# Patient Record
Sex: Male | Born: 1970 | Race: White | Hispanic: No | Marital: Married | Smoking: Never smoker
Health system: Southern US, Community
[De-identification: ages and names within clinical notes are randomized; demographics above are authoritative.]

## PROBLEM LIST (undated history)

## (undated) DIAGNOSIS — N200 Calculus of kidney: Secondary | ICD-10-CM

## (undated) HISTORY — PX: CERVICAL DISC SURGERY: SHX588

---

## 2001-09-23 ENCOUNTER — Encounter: Payer: Self-pay | Admitting: Emergency Medicine

## 2001-09-23 ENCOUNTER — Emergency Department (HOSPITAL_COMMUNITY): Admission: EM | Admit: 2001-09-23 | Discharge: 2001-09-23 | Payer: Self-pay | Admitting: Emergency Medicine

## 2001-10-03 ENCOUNTER — Emergency Department (HOSPITAL_COMMUNITY): Admission: EM | Admit: 2001-10-03 | Discharge: 2001-10-03 | Payer: Self-pay | Admitting: *Deleted

## 2001-10-03 ENCOUNTER — Encounter: Payer: Self-pay | Admitting: Emergency Medicine

## 2001-10-12 ENCOUNTER — Ambulatory Visit (HOSPITAL_BASED_OUTPATIENT_CLINIC_OR_DEPARTMENT_OTHER): Admission: RE | Admit: 2001-10-12 | Discharge: 2001-10-12 | Payer: Self-pay | Admitting: Urology

## 2001-10-12 ENCOUNTER — Encounter (INDEPENDENT_AMBULATORY_CARE_PROVIDER_SITE_OTHER): Payer: Self-pay | Admitting: Specialist

## 2008-12-19 ENCOUNTER — Ambulatory Visit (HOSPITAL_COMMUNITY): Admission: RE | Admit: 2008-12-19 | Discharge: 2008-12-20 | Payer: Self-pay | Admitting: Neurosurgery

## 2010-06-15 ENCOUNTER — Emergency Department (HOSPITAL_COMMUNITY)
Admission: EM | Admit: 2010-06-15 | Discharge: 2010-06-15 | Payer: Self-pay | Source: Home / Self Care | Admitting: Emergency Medicine

## 2010-07-06 ENCOUNTER — Emergency Department (HOSPITAL_COMMUNITY): Admission: EM | Admit: 2010-07-06 | Discharge: 2010-07-06 | Payer: Self-pay | Admitting: Emergency Medicine

## 2011-03-09 LAB — URINALYSIS, ROUTINE W REFLEX MICROSCOPIC
Leukocytes, UA: NEGATIVE
Nitrite: NEGATIVE
Specific Gravity, Urine: 1.024 (ref 1.005–1.030)
Urobilinogen, UA: 1 mg/dL (ref 0.0–1.0)
pH: 6 (ref 5.0–8.0)

## 2011-03-09 LAB — URINE MICROSCOPIC-ADD ON

## 2011-03-09 LAB — POCT I-STAT, CHEM 8
Calcium, Ion: 1.11 mmol/L — ABNORMAL LOW (ref 1.12–1.32)
Creatinine, Ser: 1.4 mg/dL (ref 0.4–1.5)
Glucose, Bld: 111 mg/dL — ABNORMAL HIGH (ref 70–99)
HCT: 43 % (ref 39.0–52.0)
Hemoglobin: 14.6 g/dL (ref 13.0–17.0)

## 2011-03-10 LAB — DIFFERENTIAL
Basophils Absolute: 0 10*3/uL (ref 0.0–0.1)
Basophils Relative: 1 % (ref 0–1)
Eosinophils Relative: 2 % (ref 0–5)
Lymphocytes Relative: 40 % (ref 12–46)
Monocytes Absolute: 0.4 10*3/uL (ref 0.1–1.0)
Neutro Abs: 2.4 10*3/uL (ref 1.7–7.7)

## 2011-03-10 LAB — BASIC METABOLIC PANEL
BUN: 8 mg/dL (ref 6–23)
Creatinine, Ser: 1.2 mg/dL (ref 0.4–1.5)
GFR calc non Af Amer: >60 mL/min (ref >60)
Glucose, Bld: 112 mg/dL — ABNORMAL HIGH (ref 70–99)

## 2011-03-10 LAB — CBC
MCHC: 35 g/dL (ref 30.0–36.0)
MCV: 95.6 fL (ref 78.0–100.0)
Platelets: 157 10*3/uL (ref 150–400)
RDW: 12.5 % (ref 11.5–15.5)
WBC: 4.8 10*3/uL (ref 4.0–10.5)

## 2011-05-07 NOTE — Op Note (Signed)
NAMEDASH, CARDARELLI               ACCOUNT NO.:  [id number]   MEDICAL RECORD NO.:  [id number]          PATIENT TYPE:  OIB   LOCATION:  3526                         FACILITY:  [facility]   PHYSICIAN:  Reinaldo Meeker, M.D. 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   PROCEDURE:  C5-C6 anterior cervical diskectomy with bone bank fusion  followed by Mystique anterior cervical plating.   SURGEON:  Reinaldo Meeker, MD   ASSISTANT:  Donalee Citrin, MD   SURGICAL INDICATIONS:  Mr. Dowland is a 40 year old gentleman who  presented with signs of cervical myelopathy with arm pain and numbness.  He was evaluated with MRI scan, which showed a herniated disk at C5-6  with high signal in the cord consistent with spinal cord contusion.  After discussing the options, he requested surgical intervention,  brought to the operating room at this time for C5-6 anterior cervical  diskectomy with fusion and plating.   PROCEDURE IN DETAIL:  After being placed in the supine position and 10  pounds Halter traction, the patient's neck was prepped and draped in the  usual sterile fashion.  Localizing fluoroscopy was used prior to  incision to identify the appropriate level.  Transverse incision was  made in the right anterior neck throughout the midline, headed towards  the medial aspect of sternocleidomastoid muscle.  The platysma muscle  was then incised transversely and the natural fascial plane between the  strap muscles medially and sternocleidomastoid laterally was identified  and followed down to the anterior aspect of cervical spine.  Longus  colli muscles were identified and split bilaterally with unipolar  coagulation __________ resector.  Self-retaining retractor was  placed  for exposure.  X-ray showed approach to the appropriate level.  Prior to  placing the self-retaining retractor, the patient had a bit of  bradycardia and hypotension that was managed without difficulty by  anesthesia.  They felt that we were very safe to proceed with the case  and we put the self-retaining tract in.  There was no further  bradycardia or hypertension.  The patient had no further issues during  or rest of the case.  As we discussed, C5-6 was incised.  Using  pituitary rongeurs and curettes approximately, 90% of disk material was  removed.  High-speed drill was used to widen the interspace.  Microscope  was draped, brought into the field and used for remainder of the case.  Using microsection technique, remainder of disk material down the  posterior longitudinal ligament was removed.  The wound was then incised  transversely and the cut edges removed with a Kerrison punch.  Thorough  spinal cord decompression was carried out bilaterally and proximal  foraminal decompression was carried out until the C6 nerve roots could  be identified bilaterally.  At this time, inspection was carried out in  all directions for any evidence of  residual compression and none could  be identified.  Large amounts of irrigation was carried out.  Then,  bleeding was controlled with bipolar coagulation and Gelfoam.  Measurements were taken __________ bone bank plugs reconstituted.  After  irrigating once more, confirmed hemostasis, the plugs impacted without  difficulty and fluoroscopy showed it to be in good position.  An  appropriate right Mystique anterior cervical plate was then chosen.  Under fluoroscopic guidance, drill holes were placed followed by  tapping, re-tapping, and placement of the screws without difficulty.  Fluoroscopy showed the screws plate and plug to be in good position.  Large amounts of irrigation was carried out and any  bleeding controlled with coagulation.  The  wound was then closed with  interrupted Vicryl on the platysma muscle, inverted 5-0 PDS on the  subcuticular layer and Steri-Strips on the skin.  A sterile dressing  soft collar applied.  The patient was extubated and taken to recovery  room in stable condition.            ______________________________  Reinaldo Meeker, M.D.     ROK/MEDQ  D:  12/19/2008  T:  12/19/2008  Job:  [id number]

## 2011-05-10 NOTE — Op Note (Signed)
[facility]  Patient:    Roger Hahn, Roger Hahn Visit Number: [id number] MRN: [id number]          Service Type: NES Location: [facility] Attending Physician:  Lauree Chandler Proc. Date: 10/12/01 Admit Date:  10/12/2001                             Operative Report  PREOPERATIVE DIAGNOSES:  Distal right ureteral calculus and elective sterilization.  POSTOPERATIVE DIAGNOSES:  Distal right ureteral calculus and elective sterilization.  Urethral meatal stenosis.  PROCEDURE:  Bilateral vasectomy, cystoscopy and urethral meatal dilation, right ureteroscopy and passage of stone basket.  SURGEON:  Maretta Bees. Vonita Moss, M.D.  ANESTHESIA:  General.  INDICATIONS:  This 40 year old gentleman presents to the OR for evaluation and therapy of a 3 mm stone in the distal right ureter that has been symptomatic for him for the last two and a half weeks.  He has never seen a stone pass, although he says he has been more comfortable for the last day or so.  He also wants to have a vasectomy done for sterilization purposes, and he was duly informed.  DESCRIPTION OF PROCEDURE:  The patient was brought to the operating room and placed in lithotomy position.  External genitalia were prepped and draped in the usual fashion.  Bilateral vasectomy was performed by performing bilateral incisions, isolating the vas and bringing it up into the surgical field, performing a needle coagulation in each lumen proximally and distally and then placing on a small metal hemoclip and resecting a 2 cm segment of vas on each side.  Hemostasis was obtained by the use of electrocautery.  Both vas incisions were closed with 4-0 chromic catgut.  At the end of the procedure, the wound was dressed wit dry sterile gauze dressings and a scrotal support. Cystoscopy was then performed but only after first dilating a small urethral meatus with a 24 [place] sound.  This was due to a mild  hypospadias. Cystoscopy revealed a normal anterior urethra.  The prostate was unremarkable. The bladder was normal except for some slight edema around the right ureteral orifice.  As the stone on KUB was always somewhat difficult to visualize, and I had passed a guidewire and could not see a stone for sure, I then ureteroscoped the very distal ureter and did not see a stone and passed a Pfister-Schwartz 8 mm stone basket and retrieved no stone.  My suspicion is that he may well have already passed this stone.  The guidewire was removed, the cystoscope removed, and the patient taken to the recovery room in good condition having tolerated the procedure well. Attending Physician:  Lauree Chandler DD:  10/12/01 TD:  10/12/01 Job: [id number]

## 2011-09-26 LAB — CBC
MCV: 94.9 fL (ref 78.0–100.0)
Platelets: 175 10*3/uL (ref 150–400)
RBC: 4.6 MIL/uL (ref 4.22–5.81)
WBC: 4.5 10*3/uL (ref 4.0–10.5)

## 2011-09-26 LAB — BASIC METABOLIC PANEL
BUN: 12 mg/dL (ref 6–23)
Chloride: 104 mEq/L (ref 96–112)
Creatinine, Ser: 1.13 mg/dL (ref 0.4–1.5)
GFR calc Af Amer: >60 mL/min (ref >60)
GFR calc non Af Amer: >60 mL/min (ref >60)

## 2012-04-29 ENCOUNTER — Ambulatory Visit (INDEPENDENT_AMBULATORY_CARE_PROVIDER_SITE_OTHER): Payer: BC Managed Care – PPO | Admitting: Emergency Medicine

## 2012-04-29 ENCOUNTER — Ambulatory Visit: Payer: BC Managed Care – PPO

## 2012-04-29 VITALS — BP 123/82 | HR 79 | Temp 97.9°F | Resp 18 | Ht 71.0 in | Wt 218.0 lb

## 2012-04-29 DIAGNOSIS — R202 Paresthesia of skin: Secondary | ICD-10-CM

## 2012-04-29 DIAGNOSIS — R209 Unspecified disturbances of skin sensation: Secondary | ICD-10-CM

## 2012-04-29 DIAGNOSIS — R200 Anesthesia of skin: Secondary | ICD-10-CM

## 2012-04-29 DIAGNOSIS — M542 Cervicalgia: Secondary | ICD-10-CM

## 2012-04-29 MED ORDER — HYDROCODONE-ACETAMINOPHEN 5-325 MG PO TABS: 1.0000 | ORAL_TABLET | Freq: Four times a day (QID) | ORAL | Status: AC | PRN

## 2012-04-29 MED ORDER — PREDNISONE 20 MG PO TABS: ORAL_TABLET | ORAL | Status: DC

## 2012-04-29 NOTE — Progress Notes (Signed)
  Subjective:    Patient ID: Roger Hahn, male    DOB: 12-16-1971, 41 y.o.   MRN: [id number]  HPI patient states on Sunday he started feeling of pain and discomfort in the right side of his neck. Monday he felt some better and then yesterday he developed a severe pain in his neck with radicular symptoms down his right arm. He currently is having severe pain in his neck and into the a upper right arm down into the forearm. History is pertinent in that patient has had a previous cervical disc with fusion    Review of Systems noncontributory except as relates to his present illness     Objective:   Physical Exam significant tenderness on the right side of the neck. He has limited range of motion but no focal areas of weakness of the right arm his deep tendon reflexes are symmetrical. He has diminished biceps reflex is  [facility] reading (PRIMARY) by  Dr.Kennice Finnie is evidence of a C5-C6 fusion. 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 He has a history of previous neck surgery with fusion. We'll check a C-spine.

## 2012-05-01 ENCOUNTER — Ambulatory Visit (INDEPENDENT_AMBULATORY_CARE_PROVIDER_SITE_OTHER): Payer: BC Managed Care – PPO | Admitting: Family Medicine

## 2012-05-01 ENCOUNTER — Telehealth: Payer: Self-pay

## 2012-05-01 VITALS — BP 120/76 | HR 80 | Temp 98.0°F | Resp 16 | Ht 69.5 in | Wt 214.8 lb

## 2012-05-01 DIAGNOSIS — M542 Cervicalgia: Secondary | ICD-10-CM

## 2012-05-01 MED ORDER — OXYCODONE-ACETAMINOPHEN 5-325 MG PO TABS: 1.0000 | ORAL_TABLET | Freq: Four times a day (QID) | ORAL | Status: AC | PRN

## 2012-05-01 NOTE — Telephone Encounter (Signed)
Dr Cleta Alberts   I called [facility] this morning regarding his appt, they are waiting on his chart to come from storage, in the meantime he is saying he is in so much pain and the appt will not be until next week it seems. Would like to know if we can up the pain meds for him to get him through the weekend

## 2012-05-01 NOTE — Telephone Encounter (Signed)
I. would be happy to see the patient this weekend I work days 8 AM to 3 PM on Saturday and Sunday. He can increase his Norco 7.5 mg/325 every 4-6 hours for pain. When I see him this week and we can talk about doing an MRI. It would be okay to call in #20 of the Norco/7.5/325

## 2012-05-01 NOTE — Telephone Encounter (Signed)
Pt was in the on Wednesday to see Dr Cleta Alberts, he had remembered something he needed to tell you but he just remembered. He was helping move with his old company the other day and he thinks that he possibly hurt his back then he said he remembers a twinge. Should He try to get workers comp for this? He started with a new company since then. In the meantime referrals is trying to get him an appointment quickly with Dr Gerlene Fee. Please call him and let him know what he should do since  He is not with that company anymore.

## 2012-05-01 NOTE — Telephone Encounter (Signed)
The patient called again to request different or stronger pain medicine as the medicine he has now is not working.  The patient is complaining of severe pain.  Please call patient at [phone].

## 2012-05-01 NOTE — Progress Notes (Signed)
Patient Name: Roger Hahn Date of Birth: [birth year] Medical Record Number: [id number] Gender: male Date of Encounter: 05/01/2012  History of Present Illness:  Roger Hahn is a 41 y.o. very pleasant male patient who presents with the following:  Here to recheck neck pain.  He was here in the 6th and was diagnosed with cervical radiculopathy.  He was given an rx for vicodin and had cervical films:  CERVICAL SPINE - 2-3 VIEW  Comparison: 12/19/2008 and preliminary reading of Dr. Cleta Alberts  Findings: Two views of the lumbar spine submitted. Postsurgical  changes at C5-C6 level are stable. There might be a interbody  spacer or fusion at this level without change in appearance from  prior exam. There is reversal of the cervical lordosis. Mild disc  space flattening at C4-C5 level. Minimal disc space flattening at  C3-C4 level. No prevertebral soft tissue swelling. Cervical airway  is patent.  IMPRESSION:  1. Stable postsurgical changes at C5-C6 level. Reversal of the  cervical lordosis. Mild disc space flattening at C4-C5 level.  Minimal disc space flattening at C3-C4 level. No prevertebral soft  tissue swelling.  Clinically significant discrepancy from primary report, if  provided: None   He has a history of cervical fusion about 4 years ago.  His neck had done really well for some time.  He had started a new job last week- this seemed to precipitate his current problem- he did do some heavy lifting recently.    He is on prednisone as well per his last visit.  Hi notes that he is still in very severe pain, and that he cannot sleep because his neck is only comfortable if he sits and leans his head a little to the left.  He continues to have pain and numbness into his right arm.  No other recent injury  There is no problem list on file for this patient.  No past medical history on file. No past surgical history on file. History  Substance Use Topics  . Smoking status: Current  Everyday Smoker  . Smokeless tobacco: Not on file  . Alcohol Use: Not on file   No family history on file. No Known Allergies  Medication list has been reviewed and updated.  Review of Systems: As per HPI- otherwise negative.   Physical Examination: Filed Vitals:   05/01/12 1731  BP: 120/76  Pulse: 80  Temp: 98 F (36.7 C)  TempSrc: Oral  Resp: 16  Height: 5' 9.5" (1.765 m)  Weight: 214 lb 12.8 oz (97.433 kg)  SpO2: 100%    Body mass index is 31.27 kg/(m^2).  GEN: WDWN, NAD, Non-toxic, A & O x 3 HEENT: Atraumatic, Normocephalic. Neck supple. No masses, No LAD. Ears and Nose: No external deformity. CV: RRR, No M/G/R. No JVD. No thrill. No extra heart sounds. PULM: CTA B, no wheezes, crackles, rhonchi. No retractions. No resp. distress. No accessory muscle use. EXTR: No c/c/e NEURO Normal gait.  PSYCH: Normally interactive. Conversant. Not depressed or anxious appearing.  Calm demeanor.  Neck: Pain with palpation of paraspinous muscles to right.  ROM is nearly 100%, a little restricted in all direction.  He has complaint of numbness in the C5/6 distribution, but has good strength   Assessment and Plan: 1. Neck pain  oxyCODONE-acetaminophen (ROXICET) 5-325 MG per tablet   Will give #20 percocet for severe neck pain and parasthesias.  If this does not improve he will likley need an MRI- he is working on getting in with  [facility] (his surgeon from the last time he had an operation) but will let us know if he needs any help in the meantime

## 2012-05-02 NOTE — Telephone Encounter (Signed)
Pt was seen yesterday by Dr Patsy Lager and was given a RX for oxycodone.

## 2015-02-25 ENCOUNTER — Ambulatory Visit (INDEPENDENT_AMBULATORY_CARE_PROVIDER_SITE_OTHER): Payer: No Typology Code available for payment source | Admitting: Family Medicine

## 2015-02-25 VITALS — BP 112/80 | HR 72 | Temp 97.6°F | Resp 16 | Ht 70.0 in | Wt 224.2 lb

## 2015-02-25 DIAGNOSIS — L03113 Cellulitis of right upper limb: Secondary | ICD-10-CM | POA: Diagnosis not present

## 2015-02-25 DIAGNOSIS — J069 Acute upper respiratory infection, unspecified: Secondary | ICD-10-CM | POA: Diagnosis not present

## 2015-02-25 MED ORDER — CEFTRIAXONE SODIUM 1 G IJ SOLR
1.0000 g | Freq: Once | INTRAMUSCULAR | Status: AC
  Administered 2015-02-25: 1 g via INTRAMUSCULAR

## 2015-02-25 MED ORDER — DOXYCYCLINE HYCLATE 100 MG PO CAPS: 100.0000 mg | ORAL_CAPSULE | Freq: Two times a day (BID) | ORAL | Status: DC

## 2015-02-25 NOTE — Progress Notes (Signed)
[facility] [address] [place] [address] [phone]  Date:  02/25/2015   Name:  Roger Hahn.   DOB:  21-Dec-1971   MRN:  [id number]  PCP:  No primary care provider on file.    Chief Complaint: Nasal Congestion; Cough; Generalized Body Aches; and Bump on Right Wrist   History of Present Illness:  Roger Hahn. is a 44 y.o. very pleasant male patient who presents with the following:  He notes onset of sinus congestion about 4 days ago- this seems to have "gottne into my chest." he has a cough, slight body aches.  He has not noted a fever, but he has felt tired and weak.   He is taking dayquil- he did take some this am.  He has a sore throat, stuffy nose.   No GI symptoms.    He also has a tender bump on his dorsal right wrist which has been present for 4- 5 days. It gets worse daily, he is not aware of any injury that could have caused this.  The area is tender and swollen, painful to touch.  He got a little clear drainage from the area the first couple of days,nothing has drained since.    He is generally in good health.   There are no active problems to display for this patient.   History reviewed. No pertinent past medical history.  History reviewed. No pertinent past surgical history.  History  Substance Use Topics  . Smoking status: Current Every Day Smoker  . Smokeless tobacco: Never Used  . Alcohol Use: 0.0 oz/week    0 Standard drinks or equivalent per week    Family History  Problem Relation Age of Onset  . Stroke Father     No Known Allergies  Medication list has been reviewed and updated.  No current outpatient prescriptions on file prior to visit.   No current facility-administered medications on file prior to visit.    Review of Systems:  As per HPI- otherwise negative.   Physical Examination: Filed Vitals:   02/25/15 1037  BP: 112/80  Pulse: 72  Temp: 97.6 F (36.4 C)  Resp: 16   Filed Vitals:   02/25/15 1037  Height: 5\' 10"  (1.778 m)  Weight: 224 lb 4 oz (101.719 kg)   Body mass index is 32.18 kg/(m^2). Ideal Body Weight: Weight in (lb) to have BMI = 25: 173.9  GEN: WDWN, NAD, Non-toxic, A & O x 3, looks well HEENT: Atraumatic, Normocephalic. Neck supple. No masses, No LAD.  Bilateral TM wnl, oropharynx normal.  PEERL,EOMI.   Ears and Nose: No external deformity. CV: RRR, No M/G/R. No JVD. No thrill. No extra heart sounds. PULM: CTA B, no wheezes, crackles, rhonchi. No retractions. No resp. distress. No accessory muscle use. EXTR: No c/c/e NEURO Normal gait.  PSYCH: Normally interactive. Conversant. Not depressed or anxious appearing.  Calm demeanor.  Right wrist: there is a tender area approx 3cm in diameter on the dorsal wrist. It has a central wound and surrounding mild swelling and redness.   Tiny amount of pus expressed from lesion with gentle squeeze  Assessment and Plan: Cellulitis of right upper extremity - Plan: doxycycline (VIBRAMYCIN) 100 MG capsule, cefTRIAXone (ROCEPHIN) injection 1 g  Viral URI  Likely viral URI.  However use abx for cellulitis on wrist. Rocephin IM, doxycycline PO See patient instructions for more details.     Signed Lamar Blinks, MD

## 2015-02-25 NOTE — Patient Instructions (Signed)
I think that your chest and sinus symptoms are likely viral, but we are going to use antibiotics anyway for your wrist. You got a shot of antibiotics today and we are going to start you on oral antibiotics twice a day.  Take the pill with food and a glass of water Also be sure to call or come back if you are not improved in 1-2 days! You can use ice on your wrist for pain and swelling, heat will help draw out any pus inside.   I hope that you feel better soon!

## 2016-09-26 ENCOUNTER — Emergency Department (HOSPITAL_COMMUNITY)
Admission: EM | Admit: 2016-09-26 | Discharge: 2016-09-26 | Disposition: A | Discharge status: Home / Self Care | Payer: Self-pay | Attending: Emergency Medicine | Admitting: Emergency Medicine

## 2016-09-26 ENCOUNTER — Encounter (HOSPITAL_COMMUNITY): Payer: Self-pay

## 2016-09-26 ENCOUNTER — Emergency Department (HOSPITAL_COMMUNITY): Payer: Self-pay

## 2016-09-26 DIAGNOSIS — N200 Calculus of kidney: Secondary | ICD-10-CM | POA: Insufficient documentation

## 2016-09-26 DIAGNOSIS — Z79899 Other long term (current) drug therapy: Secondary | ICD-10-CM | POA: Insufficient documentation

## 2016-09-26 HISTORY — DX: Calculus of kidney: N20.0

## 2016-09-26 LAB — URINE MICROSCOPIC-ADD ON

## 2016-09-26 LAB — COMPREHENSIVE METABOLIC PANEL
ALBUMIN: 4.1 g/dL (ref 3.5–5.0)
ALK PHOS: 49 U/L (ref 38–126)
ALT: 39 U/L (ref 17–63)
AST: 30 U/L (ref 15–41)
Anion gap: 7 (ref 5–15)
BILIRUBIN TOTAL: 1.1 mg/dL (ref 0.3–1.2)
BUN: 17 mg/dL (ref 6–20)
CALCIUM: 9.2 mg/dL (ref 8.9–10.3)
CO2: 25 mmol/L (ref 22–32)
Chloride: 105 mmol/L (ref 101–111)
Creatinine, Ser: 1.53 mg/dL — ABNORMAL HIGH (ref 0.61–1.24)
GFR calc Af Amer: >60 mL/min (ref >60)
GFR calc non Af Amer: 53 mL/min — ABNORMAL LOW (ref >60)
GLUCOSE: 122 mg/dL — AB (ref 65–99)
POTASSIUM: 3.7 mmol/L (ref 3.5–5.1)
SODIUM: 137 mmol/L (ref 135–145)
TOTAL PROTEIN: 7.3 g/dL (ref 6.5–8.1)

## 2016-09-26 LAB — CBC WITH DIFFERENTIAL/PLATELET
BASOS ABS: 0 10*3/uL (ref 0.0–0.1)
Basophils Relative: 0 %
EOS PCT: 0 %
Eosinophils Absolute: 0 10*3/uL (ref 0.0–0.7)
HEMATOCRIT: 42.8 % (ref 39.0–52.0)
Hemoglobin: 15.6 g/dL (ref 13.0–17.0)
LYMPHS PCT: 9 %
Lymphs Abs: 1 10*3/uL (ref 0.7–4.0)
MCH: 32.9 pg (ref 26.0–34.0)
MCHC: 36.4 g/dL — AB (ref 30.0–36.0)
MCV: 90.3 fL (ref 78.0–100.0)
MONO ABS: 0.4 10*3/uL (ref 0.1–1.0)
MONOS PCT: 4 %
NEUTROS ABS: 9.1 10*3/uL — AB (ref 1.7–7.7)
Neutrophils Relative %: 87 %
PLATELETS: 216 10*3/uL (ref 150–400)
RBC: 4.74 MIL/uL (ref 4.22–5.81)
RDW: 11.7 % (ref 11.5–15.5)
WBC: 10.5 10*3/uL (ref 4.0–10.5)

## 2016-09-26 LAB — URINALYSIS, ROUTINE W REFLEX MICROSCOPIC
Bilirubin Urine: NEGATIVE
Glucose, UA: NEGATIVE mg/dL
Ketones, ur: 15 mg/dL — AB
LEUKOCYTES UA: NEGATIVE
NITRITE: NEGATIVE
PH: 7 (ref 5.0–8.0)
Protein, ur: NEGATIVE mg/dL
SPECIFIC GRAVITY, URINE: 1.03 (ref 1.005–1.030)

## 2016-09-26 MED ORDER — TAMSULOSIN HCL 0.4 MG PO CAPS: 0.4000 mg | ORAL_CAPSULE | Freq: Every day | ORAL | 0 refills | Status: DC

## 2016-09-26 MED ORDER — SODIUM CHLORIDE 0.9 % IV BOLUS (SEPSIS)
1000.0000 mL | Freq: Once | INTRAVENOUS | Status: AC
  Administered 2016-09-26: 1000 mL via INTRAVENOUS

## 2016-09-26 MED ORDER — ONDANSETRON 4 MG PO TBDP
4.0000 mg | ORAL_TABLET | Freq: Once | ORAL | Status: AC | PRN
  Administered 2016-09-26: 4 mg via ORAL
  Filled 2016-09-26: qty 1

## 2016-09-26 MED ORDER — OXYCODONE-ACETAMINOPHEN 5-325 MG PO TABS: 2.0000 | ORAL_TABLET | ORAL | 0 refills | Status: DC | PRN

## 2016-09-26 MED ORDER — KETOROLAC TROMETHAMINE 30 MG/ML IJ SOLN
30.0000 mg | Freq: Once | INTRAMUSCULAR | Status: AC
  Administered 2016-09-26: 30 mg via INTRAVENOUS
  Filled 2016-09-26: qty 1

## 2016-09-26 MED ORDER — ONDANSETRON HCL 4 MG PO TABS: 4.0000 mg | ORAL_TABLET | Freq: Four times a day (QID) | ORAL | 0 refills | Status: DC

## 2016-09-26 MED ORDER — OXYCODONE-ACETAMINOPHEN 5-325 MG PO TABS
1.0000 | ORAL_TABLET | ORAL | Status: DC | PRN
  Administered 2016-09-26: 1 via ORAL
  Filled 2016-09-26: qty 1

## 2016-09-26 NOTE — ED Notes (Signed)
Pt given urinal and aware urine sample is needed. 

## 2016-09-26 NOTE — Progress Notes (Signed)
EDCM went to speak to patient at bedside, however, patient was not in his room.

## 2016-09-26 NOTE — ED Triage Notes (Signed)
Pt c/o R flank pain radiating into RLQ and emesis starting today.  Pain score 10/10.  Pt reports difficulty urinating.  Pt reports taking ibuprofen w/o relief.  Hx of kidney stones.

## 2016-09-26 NOTE — Discharge Instructions (Signed)
You have been seen in the Emergency Department (ED) today for pain that we believe based on your workup, is caused by kidney stones.  As we have discussed, please drink plenty of fluids.  Please make a follow up appointment with the physician(s) listed elsewhere in this documentation. ° °You may take pain medication as needed but ONLY as prescribed.  Please also take your prescribed Flomax daily.  We also recommend that you take over-the-counter ibuprofen regularly according to label instructions over the next 5 days.  Take it with meals to minimize stomach discomfort. ° °Please see your doctor as soon as possible as stones may take 1-3 weeks to pass and you may require additional care or medications. ° °Do not drink alcohol, drive or participate in any other potentially dangerous activities while taking opiate pain medication as it may make you sleepy. Do not take this medication with any other sedating medications, either prescription or over-the-counter. If you were prescribed Percocet or Vicodin, do not take these with acetaminophen (Tylenol) as it is already contained within these medications. °  °Take Percocet as needed for severe pain.  This medication is an opiate (or narcotic) pain medication and can be habit forming.  Use it as little as possible to achieve adequate pain control.  Do not use or use it with extreme caution if you have a history of opiate abuse or dependence.  If you are on a pain contract with your primary care doctor or a pain specialist, be sure to let them know you were prescribed this medication today from the Emergency Department.  This medication is intended for your use only - do not give any to anyone else and keep it in a secure place where nobody else, especially children, have access to it.  It will also cause or worsen constipation, so you may want to consider taking an over-the-counter stool softener while you are taking this medication. ° °Return to the Emergency Department  (ED) or call your doctor if you have any worsening pain, fever, painful urination, are unable to urinate, or develop other symptoms that concern you. ° ° ° °Kidney Stones °Kidney stones (urolithiasis) are deposits that form inside your kidneys. The intense pain is caused by the stone moving through the urinary tract. When the stone moves, the ureter goes into spasm around the stone. The stone is usually passed in the urine.  °CAUSES  °A disorder that makes certain neck glands produce too much parathyroid hormone (primary hyperparathyroidism). °A buildup of uric acid crystals, similar to gout in your joints. °Narrowing (stricture) of the ureter. °A kidney obstruction present at birth (congenital obstruction). °Previous surgery on the kidney or ureters. °Numerous kidney infections. °SYMPTOMS  °Feeling sick to your stomach (nauseous). °Throwing up (vomiting). °Blood in the urine (hematuria). °Pain that usually spreads (radiates) to the groin. °Frequency or urgency of urination. °DIAGNOSIS  °Taking a history and physical exam. °Blood or urine tests. °CT scan. °Occasionally, an examination of the inside of the urinary bladder (cystoscopy) is performed. °TREATMENT  °Observation. °Increasing your fluid intake. °Extracorporeal shock wave lithotripsy--This is a noninvasive procedure that uses shock waves to break up kidney stones. °Surgery may be needed if you have severe pain or persistent obstruction. There are various surgical procedures. Most of the procedures are performed with the use of small instruments. Only small incisions are needed to accommodate these instruments, so recovery time is minimized. °The size, location, and chemical composition are all important variables that will determine the proper   choice of action for you. Talk to your health care provider to better understand your situation so that you will minimize the risk of injury to yourself and your kidney.  °HOME CARE INSTRUCTIONS  °Drink enough water  and fluids to keep your urine clear or pale yellow. This will help you to pass the stone or stone fragments. °Strain all urine through the provided strainer. Keep all particulate matter and stones for your health care provider to see. The stone causing the pain may be as small as a grain of salt. It is very important to use the strainer each and every time you pass your urine. The collection of your stone will allow your health care provider to analyze it and verify that a stone has actually passed. The stone analysis will often identify what you can do to reduce the incidence of recurrences. °Only take over-the-counter or prescription medicines for pain, discomfort, or fever as directed by your health care provider. °Keep all follow-up visits as told by your health care provider. This is important. °Get follow-up X-rays if required. The absence of pain does not always mean that the stone has passed. It may have only stopped moving. If the urine remains completely obstructed, it can cause loss of kidney function or even complete destruction of the kidney. It is your responsibility to make sure X-rays and follow-ups are completed. Ultrasounds of the kidney can show blockages and the status of the kidney. Ultrasounds are not associated with any radiation and can be performed easily in a matter of minutes. °Make changes to your daily diet as told by your health care provider. You may be told to: °Limit the amount of salt that you eat. °Eat 5 or more servings of fruits and vegetables each day. °Limit the amount of meat, poultry, fish, and eggs that you eat. °Collect a 24-hour urine sample as told by your health care provider. You may need to collect another urine sample every 6-12 months. °SEEK MEDICAL CARE IF: °You experience pain that is progressive and unresponsive to any pain medicine you have been prescribed. °SEEK IMMEDIATE MEDICAL CARE IF:  °Pain cannot be controlled with the prescribed medicine. °You have a  fever or shaking chills. °The severity or intensity of pain increases over 18 hours and is not relieved by pain medicine. °You develop a new onset of abdominal pain. °You feel faint or pass out. °You are unable to urinate. °  °This information is not intended to replace advice given to you by your health care provider. Make sure you discuss any questions you have with your health care provider. °  °Document Released: 12/09/2005 Document Revised: 08/30/2015 Document Reviewed: 05/12/2013 °Elsevier Interactive Patient Education ©2016 [employer] ° ° °

## 2016-09-26 NOTE — ED Notes (Signed)
Pain medication given in Triage. Patient advised about side effects of medications and  to avoid driving for a minimum of 4 hours.  

## 2016-09-26 NOTE — ED Provider Notes (Signed)
Emergency Department Provider Note   I have reviewed the triage vital signs and the nursing notes.   HISTORY  Chief Complaint Flank Pain; Abdominal Pain; and Emesis   HPI Roger Hahn. is a 45 y.o. male with PMH of kidney stones presents to the emergency department for evaluation of right flank pain. Patient describes pain similar to kidney stones in the past but states it is unusual because it is slightly higher than his kidney stone pain and has been coming and going less frequently. He reports a month ago he had an episode of dark urine was followed by kidney stone pain week later. He has had intermittent right flank pain over the last several days. Pain became severe today which brought in to the emergency department. No fever or chills. The patient has seen a urologist in the past. He is required intervention for kidney stones previously.  Past Medical History:  Diagnosis Date  . Kidney stones     There are no active problems to display for this patient.   Past Surgical History:  Procedure Laterality Date  . CERVICAL DISC SURGERY      Current Outpatient Rx  . Order #: [id number] Class: Historical Med  . Order #: [id number] Class: Normal  . Order #: [id number] Class: Print  . Order #: [id number] Class: Print  . Order #: [id number] Class: Print    Allergies Review of patient's allergies indicates no known allergies.  Family History  Problem Relation Age of Onset  . Stroke Father     Social History Social History  Substance Use Topics  . Smoking status: Never Smoker  . Smokeless tobacco: Never Used  . Alcohol use 0.0 oz/week    Review of Systems  Constitutional: No fever/chills Eyes: No visual changes. ENT: No sore throat. Cardiovascular: Denies chest pain. Respiratory: Denies shortness of breath. Gastrointestinal: No abdominal pain. Positive nausea and vomiting.  No diarrhea.  No constipation. Genitourinary: Negative for dysuria. Positive right flank pain.    Musculoskeletal: Negative for back pain. Skin: Negative for rash. Neurological: Negative for headaches, focal weakness or numbness.  10-point ROS otherwise negative.  ____________________________________________   PHYSICAL EXAM:  VITAL SIGNS: ED Triage Vitals  Enc Vitals Group     BP 09/26/16 1728 147/88     Pulse Rate 09/26/16 1728 77     Resp 09/26/16 1728 18     Temp 09/26/16 1728 98.2 F (36.8 C)     Temp Source 09/26/16 1728 Oral     SpO2 09/26/16 1728 100 %     Weight 09/26/16 1729 225 lb (102.1 kg)     Height 09/26/16 1729 5\' 11"  (1.803 m)     Pain Score 09/26/16 1739 10   Constitutional: Alert and oriented. Well appearing and in no acute distress. Eyes: Conjunctivae are normal. Head: Atraumatic. Nose: No congestion/rhinnorhea. Mouth/Throat: Mucous membranes are moist.  Oropharynx non-erythematous. Neck: No stridor.   Cardiovascular: Normal rate, regular rhythm. Good peripheral circulation. Grossly normal heart sounds.   Respiratory: Normal respiratory effort.  No retractions. Lungs CTAB. Gastrointestinal: Soft and nontender. No distention. No CVA tenderness.  Musculoskeletal: No lower extremity tenderness nor edema. No gross deformities of extremities. Neurologic:  Normal speech and language. No gross focal neurologic deficits are appreciated.  Skin:  Skin is warm, dry and intact. No rash noted. Psychiatric: Mood and affect are normal. Speech and behavior are normal.  ____________________________________________   LABS (all labs ordered are listed, but only abnormal results are displayed)  Labs Reviewed  URINALYSIS,  ROUTINE W REFLEX MICROSCOPIC (NOT AT [facility]) - Abnormal; Notable for the following:       Result Value   APPearance CLOUDY (*)    Hgb urine dipstick MODERATE (*)    Ketones, ur 15 (*)    All other components within normal limits  COMPREHENSIVE METABOLIC PANEL - Abnormal; Notable for the following:    Glucose, Bld 122 (*)    Creatinine, Ser 1.53  (*)    GFR calc non Af Amer 53 (*)    All other components within normal limits  CBC WITH DIFFERENTIAL/PLATELET - Abnormal; Notable for the following:    MCHC 36.4 (*)    Neutro Abs 9.1 (*)    All other components within normal limits  URINE MICROSCOPIC-ADD ON - Abnormal; Notable for the following:    Squamous Epithelial / LPF 0-5 (*)    Bacteria, UA RARE (*)    All other components within normal limits   ____________________________________________  RADIOLOGY  Ct Renal Stone Study  Result Date: 09/26/2016 CLINICAL DATA:  Severe right flank pain radiating into the right lower quadrant of the abdomen with associated vomiting today. History of nephrolithiasis. EXAM: CT ABDOMEN AND PELVIS WITHOUT CONTRAST TECHNIQUE: Multidetector CT imaging of the abdomen and pelvis was performed following the standard protocol without IV contrast. COMPARISON:  06/15/2010. FINDINGS: Lower chest: No acute abnormality. Hepatobiliary: No focal liver abnormality is seen. No gallstones, gallbladder wall thickening, or biliary dilatation. Pancreas: Unremarkable. No pancreatic ductal dilatation or surrounding inflammatory changes. Spleen: Normal in size without focal abnormality. Adrenals/Urinary Tract: Normal appearing adrenal glands, left kidney, left ureter head poorly distended urinary bladder. 8 mm calculus in the proximal right ureter at or just below the ureteropelvic junction. Moderate dilatation of the right renal collecting system with right perinephric soft tissue stranding. No additional right ureteral calculi are seen. Stomach/Bowel: Stomach is within normal limits. Appendix appears normal. No evidence of bowel wall thickening, distention, or inflammatory changes. Vascular/Lymphatic: No significant vascular findings are present. No enlarged abdominal or pelvic lymph nodes. Reproductive: Normal sized prostate. Other: Bilateral upper scrotal/inferior inguinal canal spur clips. Small umbilical hernia containing  fat. Musculoskeletal: Mild lumbar and lower thoracic spine degenerative changes. Minimal bilateral hip degenerative changes. IMPRESSION: 1. 8 mm proximal right ureteral calculus causing moderate right hydronephrosis. 2. Small umbilical hernia containing fat. Electronically Signed   By: Claudie Revering M.D.   On: 09/26/2016 19:12    ____________________________________________   PROCEDURES  Procedure(s) performed:   Procedures  None ____________________________________________   INITIAL IMPRESSION / ASSESSMENT AND PLAN / ED COURSE  Pertinent labs & imaging results that were available during my care of the patient were reviewed by me and considered in my medical decision making (see chart for details).  Patient resents to the emergency department for evaluation of right flank pain consistent with prior episodes of kidney stone. No fevers. Pain is slightly atypical has been intermittent over a longer period so I plan to obtain labs and CT abdomen/pelvis to evaluate for atypical pain cause. Abdomen exam is largely benign.   08:32 PM Spoke with urology regarding the patient's presentation and CT scan with 32mm proximal stone. He assist in arranging close follow-up. Plan to discharge the patient with pain medication and Flomax. Discussed return precautions in detail. Provided contact information for [facility]. Patient pain is well controlled at discharge.   At this time, I do not feel there is any life-threatening condition present. I have reviewed and discussed all results (EKG, imaging, lab, urine  as appropriate), exam findings with patient. I have reviewed nursing notes and appropriate previous records.  I feel the patient is safe to be discharged home without further emergent workup. Discussed usual and customary return precautions. Patient and family (if present) verbalize understanding and are comfortable with this plan.  Patient will follow-up with their primary care provider. If they  do not have a primary care provider, information for follow-up has been provided to them. All questions have been answered.  ____________________________________________  FINAL CLINICAL IMPRESSION(S) / ED DIAGNOSES  Final diagnoses:  Kidney stone     MEDICATIONS GIVEN DURING THIS VISIT:  Medications  oxyCODONE-acetaminophen (PERCOCET/ROXICET) 5-325 MG per tablet 1 tablet (1 tablet Oral Given 09/26/16 1745)  ondansetron (ZOFRAN-ODT) disintegrating tablet 4 mg (4 mg Oral Given 09/26/16 1745)  ketorolac (TORADOL) 30 MG/ML injection 30 mg (30 mg Intravenous Given 09/26/16 1928)  sodium chloride 0.9 % bolus 1,000 mL (0 mLs Intravenous Stopped 09/26/16 2035)     NEW OUTPATIENT MEDICATIONS STARTED DURING THIS VISIT:  Discharge Medication List as of 09/26/2016  8:35 PM    START taking these medications   Details  ondansetron (ZOFRAN) 4 MG tablet Take 1 tablet (4 mg total) by mouth every 6 (six) hours., Starting Thu 09/26/2016, Print    oxyCODONE-acetaminophen (PERCOCET/ROXICET) 5-325 MG tablet Take 2 tablets by mouth every 4 (four) hours as needed for severe pain., Starting Thu 09/26/2016, Print    tamsulosin (FLOMAX) 0.4 MG CAPS capsule Take 1 capsule (0.4 mg total) by mouth daily., Starting Thu 09/26/2016, Print          Note:  This document was prepared using Dragon voice recognition software and may include unintentional dictation errors.  Nanda Quinton, MD Emergency Medicine   Margette Fast, MD 09/26/16 [phone]

## 2016-09-27 ENCOUNTER — Other Ambulatory Visit: Payer: Self-pay | Admitting: Urology

## 2016-09-27 ENCOUNTER — Encounter (HOSPITAL_COMMUNITY): Payer: Self-pay | Admitting: *Deleted

## 2016-09-27 NOTE — Progress Notes (Signed)
  Pre Litho instructions given to patient and verbalized understanding:  No aspirin ,ibubrofen, advil toradol 72 hours prior to lithotripsy review list in blue folder

## 2016-09-30 ENCOUNTER — Encounter (HOSPITAL_COMMUNITY): Payer: Self-pay

## 2016-09-30 ENCOUNTER — Ambulatory Visit (HOSPITAL_COMMUNITY)
Admission: RE | Admit: 2016-09-30 | Discharge: 2016-09-30 | Disposition: A | Discharge status: Home / Self Care | Payer: No Typology Code available for payment source | Source: Ambulatory Visit | Attending: Urology | Admitting: Urology

## 2016-09-30 ENCOUNTER — Ambulatory Visit (HOSPITAL_COMMUNITY): Payer: No Typology Code available for payment source

## 2016-09-30 ENCOUNTER — Encounter (HOSPITAL_COMMUNITY)
Admission: RE | Disposition: A | Discharge status: Home / Self Care | Payer: Self-pay | Source: Ambulatory Visit | Attending: Urology

## 2016-09-30 DIAGNOSIS — N201 Calculus of ureter: Secondary | ICD-10-CM | POA: Diagnosis present

## 2016-09-30 SURGERY — LITHOTRIPSY, ESWL
Anesthesia: LOCAL | Laterality: Right

## 2016-09-30 MED ORDER — DIAZEPAM 5 MG PO TABS
10.0000 mg | ORAL_TABLET | ORAL | Status: AC
Start: 2016-09-30 — End: 2016-09-30
  Administered 2016-09-30: 10 mg via ORAL
  Filled 2016-09-30: qty 2

## 2016-09-30 MED ORDER — DIPHENHYDRAMINE HCL 25 MG PO CAPS
25.0000 mg | ORAL_CAPSULE | ORAL | Status: AC
  Administered 2016-09-30: 25 mg via ORAL
  Filled 2016-09-30: qty 1

## 2016-09-30 MED ORDER — CIPROFLOXACIN HCL 500 MG PO TABS
500.0000 mg | ORAL_TABLET | ORAL | Status: AC
  Administered 2016-09-30: 500 mg via ORAL
  Filled 2016-09-30: qty 1

## 2016-09-30 MED ORDER — SODIUM CHLORIDE 0.9 % IV SOLN
INTRAVENOUS | Status: DC
  Administered 2016-09-30: 14:00:00 via INTRAVENOUS

## 2016-09-30 NOTE — Progress Notes (Signed)
Pt states his wife Ishmael Holter is coming to get him.  Cell phone [phone].  Called Heidi's cell to see if she was coming and got pt's voicemail.  Left message for her.  Wife's cell number is on front of chart for the MD to call her after procedure.

## 2016-09-30 NOTE — Discharge Instructions (Signed)
Moderate Conscious Sedation, Adult, Care After °Refer to this sheet in the next few weeks. These instructions provide you with information on caring for yourself after your procedure. Your health care provider may also give you more specific instructions. Your treatment has been planned according to current medical practices, but problems sometimes occur. Call your health care provider if you have any problems or questions after your procedure. °WHAT TO EXPECT AFTER THE PROCEDURE  °After your procedure: °· You may feel sleepy, clumsy, and have poor balance for several hours. °· Vomiting may occur if you eat too soon after the procedure. °HOME CARE INSTRUCTIONS °· Do not participate in any activities where you could become injured for at least 24 hours. Do not: °¨ Drive. °¨ Swim. °¨ Ride a bicycle. °¨ Operate heavy machinery. °¨ Cook. °¨ Use power tools. °¨ Climb ladders. °¨ Work from a high place. °· Do not make important decisions or sign legal documents until you are improved. °· If you vomit, drink water, juice, or soup when you can drink without vomiting. Make sure you have little or no nausea before eating solid foods. °· Only take over-the-counter or prescription medicines for pain, discomfort, or fever as directed by your health care provider. °· Make sure you and your family fully understand everything about the medicines given to you, including what side effects may occur. °· You should not drink alcohol, take sleeping pills, or take medicines that cause drowsiness for at least 24 hours. °· If you smoke, do not smoke without supervision. °· If you are feeling better, you may resume normal activities 24 hours after you were sedated. °· Keep all appointments with your health care provider. °SEEK MEDICAL CARE IF: °· Your skin is pale or bluish in color. °· You continue to feel nauseous or vomit. °· Your pain is getting worse and is not helped by medicine. °· You have bleeding or swelling. °· You are still  sleepy or feeling clumsy after 24 hours. °SEEK IMMEDIATE MEDICAL CARE IF: °· You develop a rash. °· You have difficulty breathing. °· You develop any type of allergic problem. °· You have a fever. °MAKE SURE YOU: °· Understand these instructions. °· Will watch your condition. °· Will get help right away if you are not doing well or get worse. °  °This information is not intended to replace advice given to you by your health care provider. Make sure you discuss any questions you have with your health care provider. °  °Document Released: 09/29/2013 Document Revised: 12/30/2014 Document Reviewed: 09/29/2013 °Elsevier Interactive Patient Education ©2016 [employer] ° ° °

## 2017-11-30 IMAGING — CT CT RENAL STONE PROTOCOL
2 of 3 series · 16 of 46 positions shown, 18 images · non-contrast
Comparison: 06/15/2010.

CLINICAL DATA: Severe right flank pain radiating into the right
lower quadrant of the abdomen with associated vomiting today.
History of nephrolithiasis.

EXAM:
CT ABDOMEN AND PELVIS WITHOUT CONTRAST
TECHNIQUE: Multidetector CT imaging of the abdomen and pelvis was performed
following the standard protocol without IV contrast.

[Series 4: lung · axial · 0.79mm/px · z∈[+1272,+1364]mm · 13 of 54 slices shown, 15 images]
[im 4/54  soft-tissue]
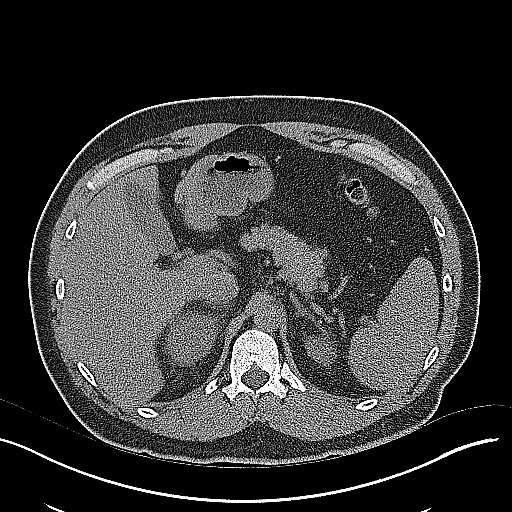
[im 4/54  bone]
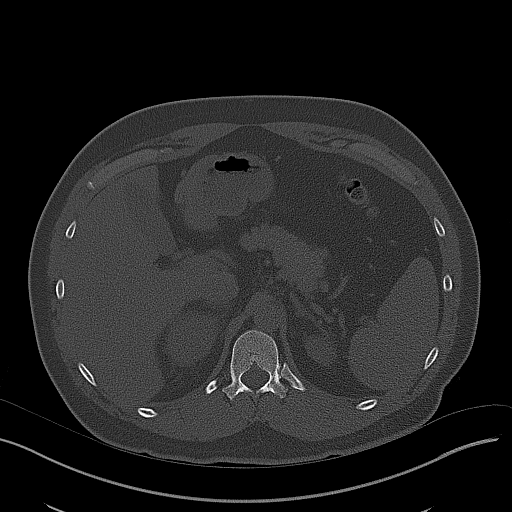
[im 7/54  soft-tissue]
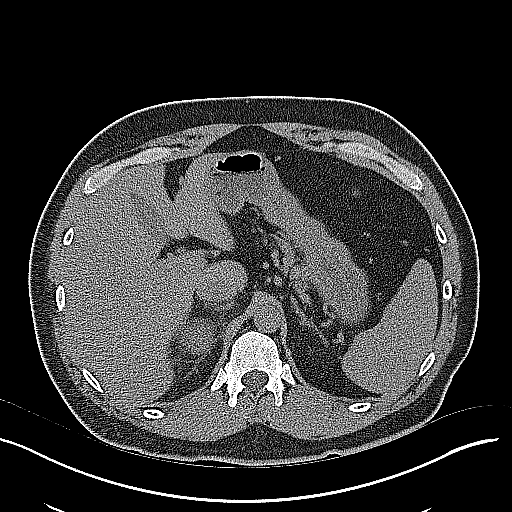
[im 11/54  soft-tissue]
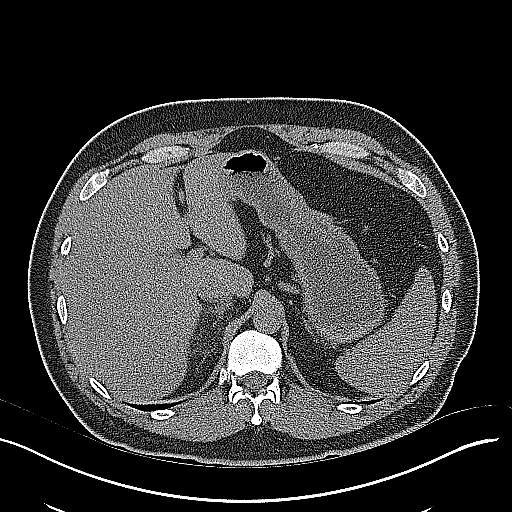
[im 16/54  soft-tissue]
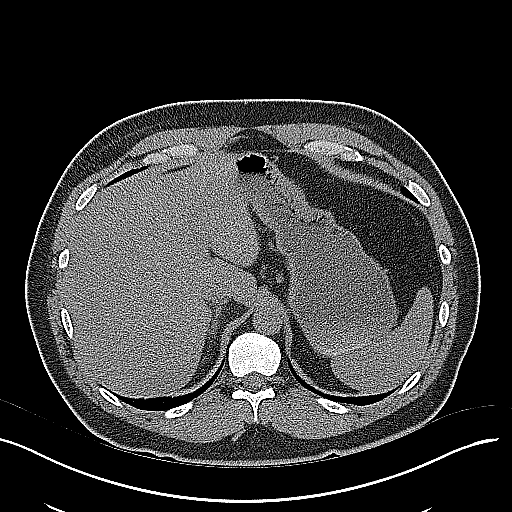
[im 19/54  soft-tissue]
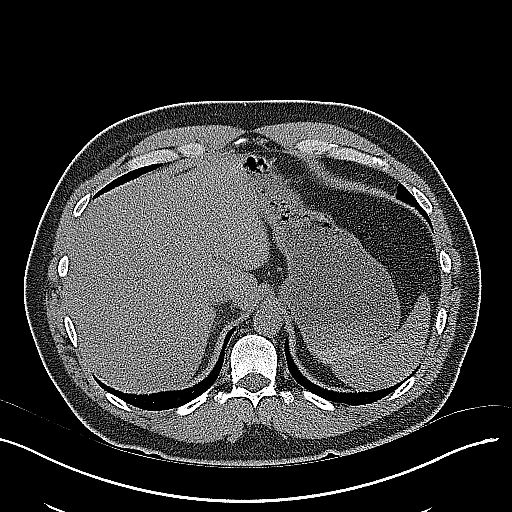
[im 23/54  soft-tissue]
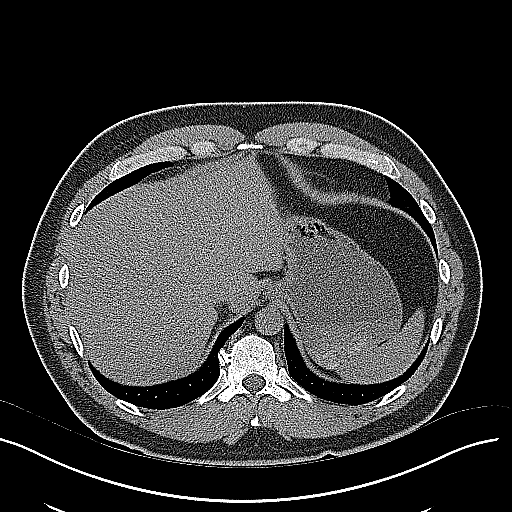
[im 28/54  soft-tissue]
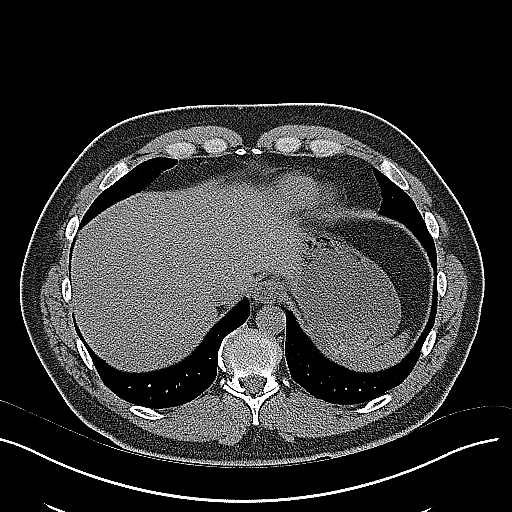
[im 31/54  soft-tissue]
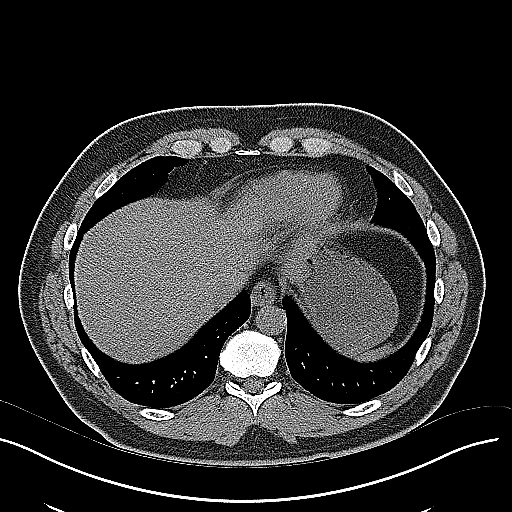
[im 35/54  soft-tissue]
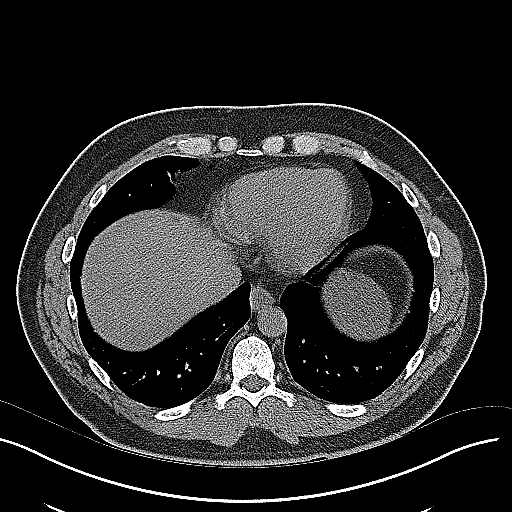
[im 35/54  bone]
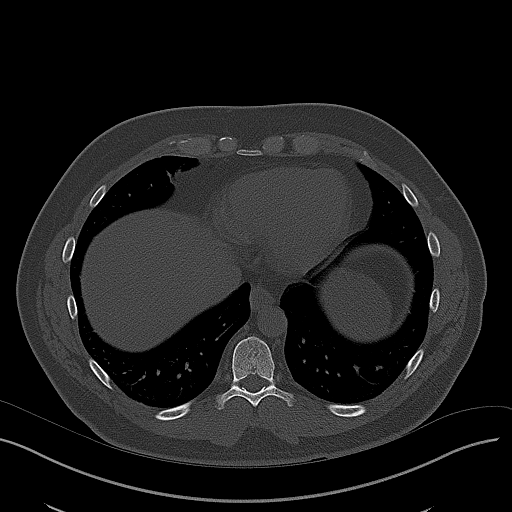
[im 38/54  soft-tissue]
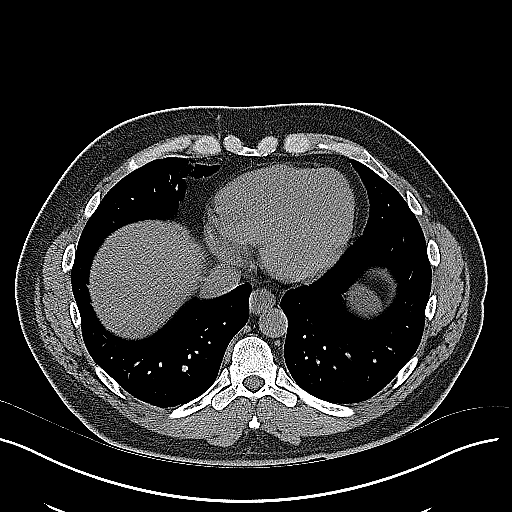
[im 43/54  soft-tissue]
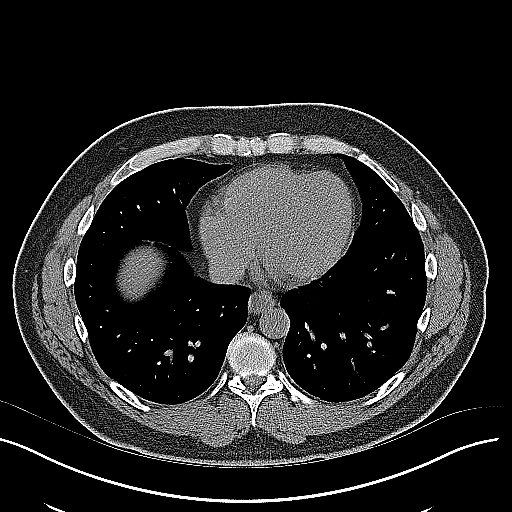
[im 47/54  soft-tissue]
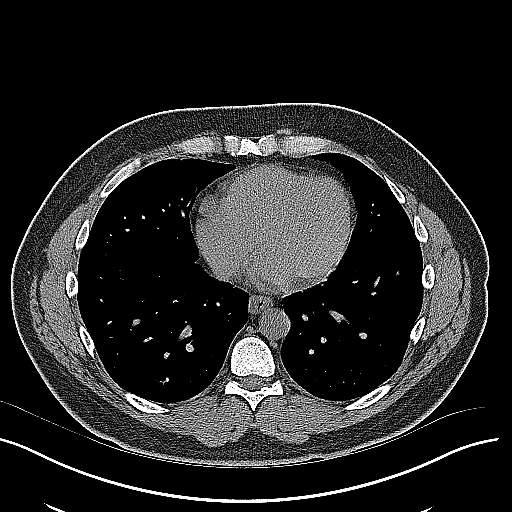
[im 50/54  soft-tissue]
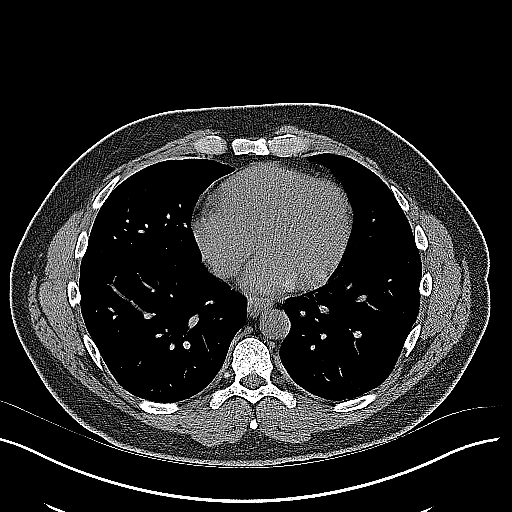

[Series 5: coronal · coronal · 0.74mm/px · 3 of 147 slices shown]
[im 49/147  soft-tissue]
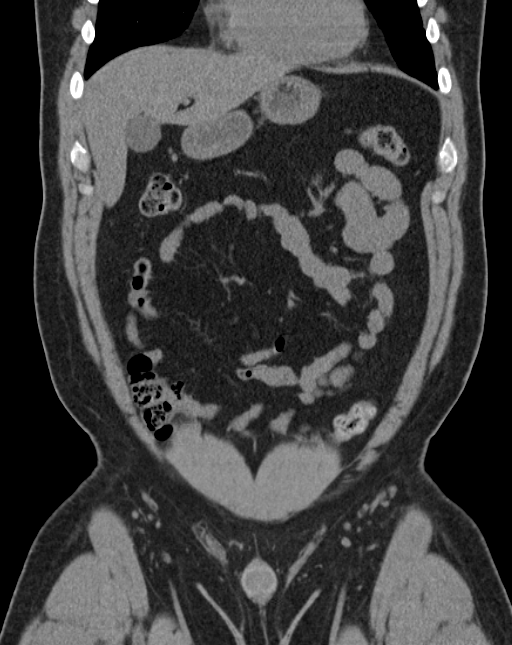
[im 65/147  soft-tissue]
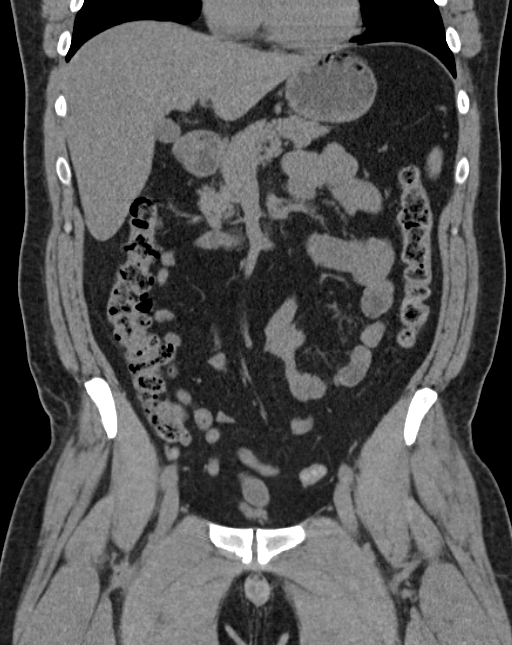
[im 82/147  soft-tissue]
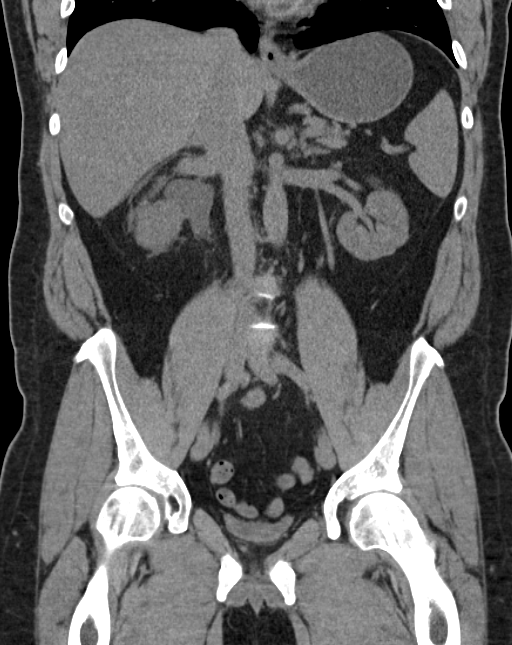

[16 of 46 positions shown; findings below may reference images not displayed]

FINDINGS: Lower chest: No acute abnormality.

Hepatobiliary: No focal liver abnormality is seen. No gallstones,
gallbladder wall thickening, or biliary dilatation.

Pancreas: Unremarkable. No pancreatic ductal dilatation or
surrounding inflammatory changes.

Spleen: Normal in size without focal abnormality.

Adrenals/Urinary Tract: Normal appearing adrenal glands, left
kidney, left ureter head poorly distended urinary bladder. 8 mm
calculus in the proximal right ureter at or just below the
ureteropelvic junction. Moderate dilatation of the right renal
collecting system with right perinephric soft tissue stranding. No
additional right ureteral calculi are seen.

Stomach/Bowel: Stomach is within normal limits. Appendix appears
normal. No evidence of bowel wall thickening, distention, or
inflammatory changes.

Vascular/Lymphatic: No significant vascular findings are present. No
enlarged abdominal or pelvic lymph nodes.

Reproductive: Normal sized prostate.

Other: Bilateral upper scrotal/inferior inguinal canal spur clips.
Small umbilical hernia containing fat.

Musculoskeletal: Mild lumbar and lower thoracic spine degenerative
changes. Minimal bilateral hip degenerative changes.
IMPRESSION: 1. 8 mm proximal right ureteral calculus causing moderate right
hydronephrosis.
2. Small umbilical hernia containing fat.

## 2017-12-15 ENCOUNTER — Other Ambulatory Visit: Payer: Self-pay | Admitting: Geriatric Medicine

## 2017-12-15 DIAGNOSIS — R0601 Orthopnea: Secondary | ICD-10-CM

## 2017-12-24 ENCOUNTER — Ambulatory Visit (INDEPENDENT_AMBULATORY_CARE_PROVIDER_SITE_OTHER): Payer: BC Managed Care – PPO

## 2017-12-24 DIAGNOSIS — R0601 Orthopnea: Secondary | ICD-10-CM

## 2017-12-24 LAB — EXERCISE TOLERANCE TEST
CHL CUP RESTING HR STRESS: 86 {beats}/min
CHL RATE OF PERCEIVED EXERTION: 16
CSEPED: 8 min
CSEPEW: 10.1 METS
Exercise duration (sec): 0 s
MPHR: 174 {beats}/min
Peak HR: 171 {beats}/min
Percent HR: 98 %

## 2018-09-02 ENCOUNTER — Other Ambulatory Visit: Payer: Self-pay | Admitting: Geriatric Medicine

## 2018-09-02 DIAGNOSIS — R1013 Epigastric pain: Secondary | ICD-10-CM

## 2018-09-07 ENCOUNTER — Ambulatory Visit
Admission: RE | Admit: 2018-09-07 | Discharge: 2018-09-07 | Disposition: A | Discharge status: Home / Self Care | Payer: BC Managed Care – PPO | Source: Ambulatory Visit | Attending: Geriatric Medicine | Admitting: Geriatric Medicine

## 2018-09-07 DIAGNOSIS — R1013 Epigastric pain: Secondary | ICD-10-CM

## 2019-06-20 IMAGING — US US ABDOMEN LIMITED
1 series · 14 of 25 positions shown · non-contrast
Comparison: CT 09/26/2016

CLINICAL DATA: Epigastric pain

EXAM:
ULTRASOUND ABDOMEN LIMITED RIGHT UPPER QUADRANT

[Series 1: us abdomen limited · 0.20mm/px · 14 of 39 slices shown]
[im 1/39]
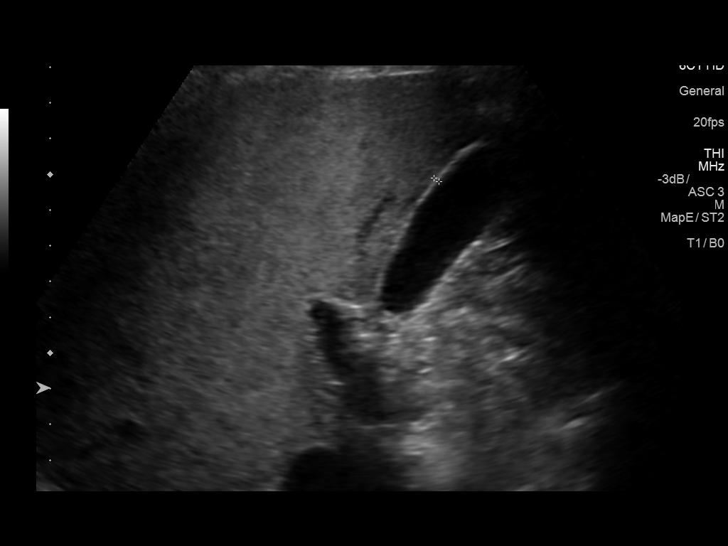
[im 4/39]
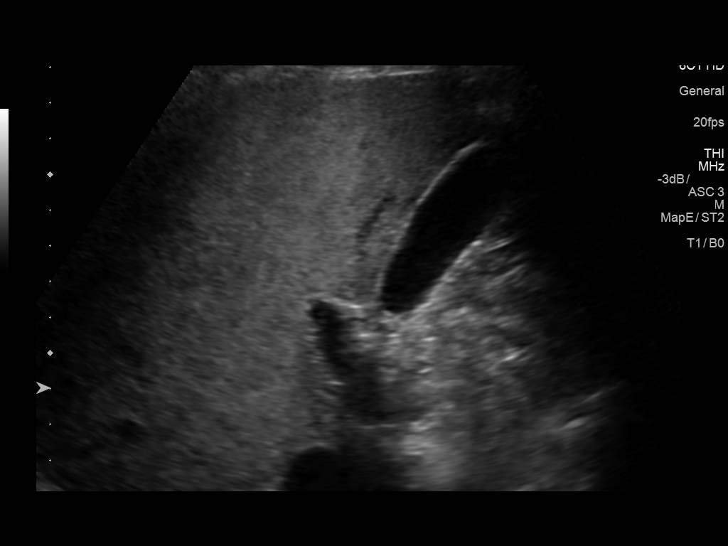
[im 7/39]
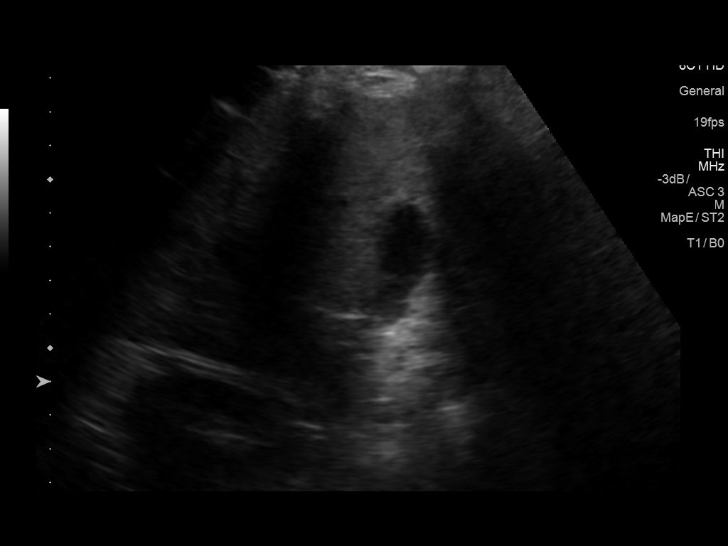
[im 10/39]
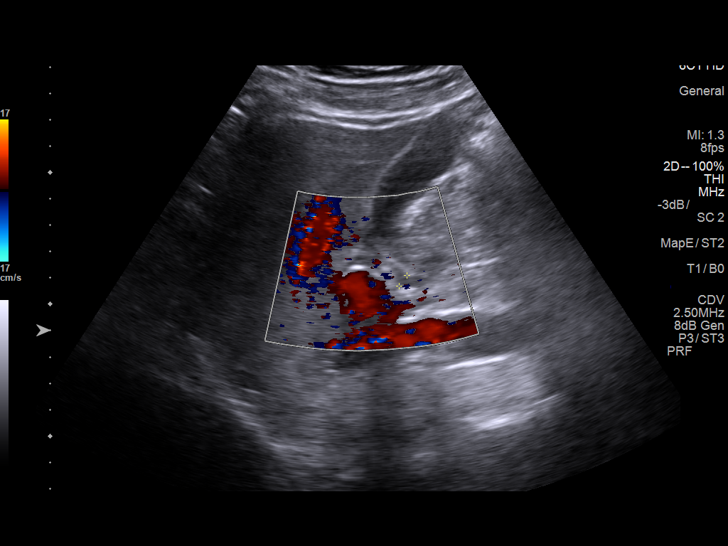
[im 13/39]
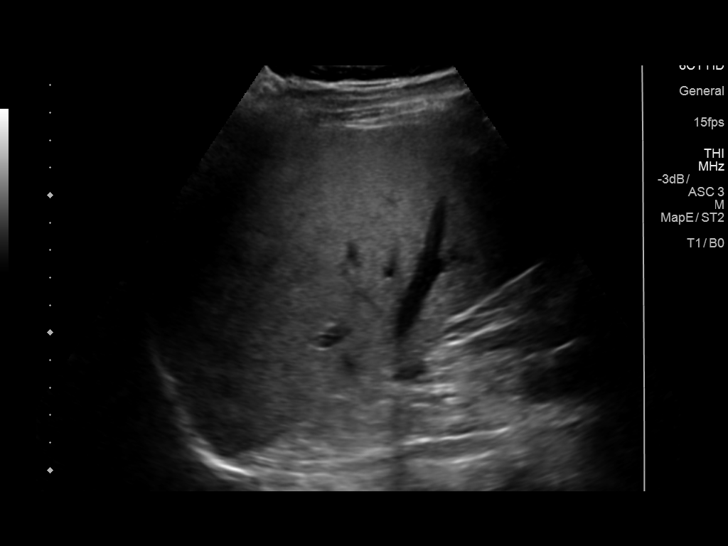
[im 15/39]
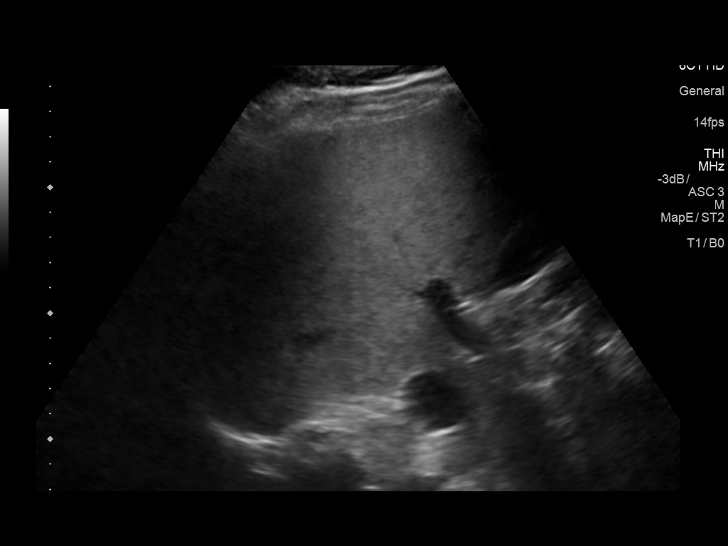
[im 18/39]
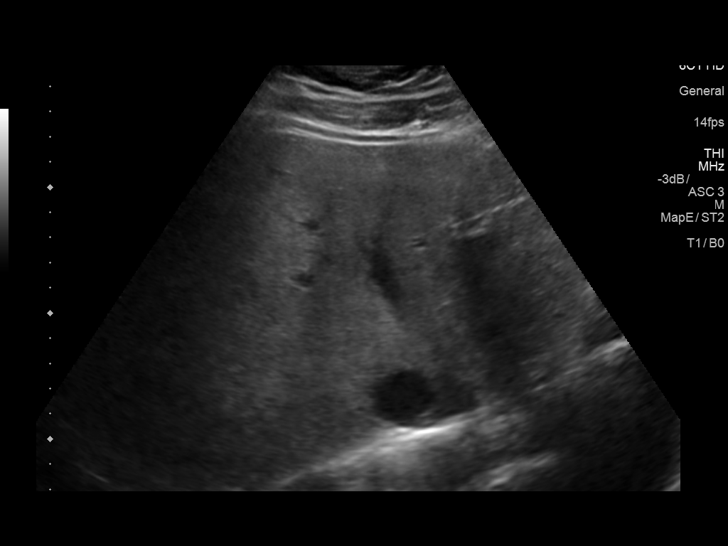
[im 21/39]
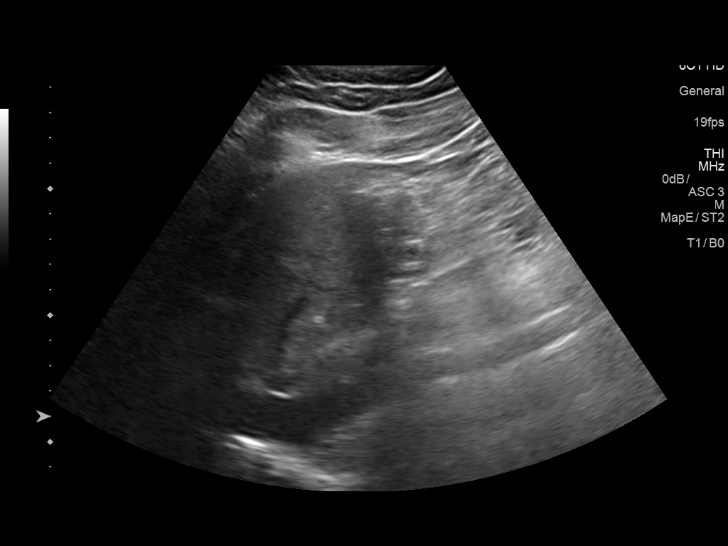
[im 24/39]
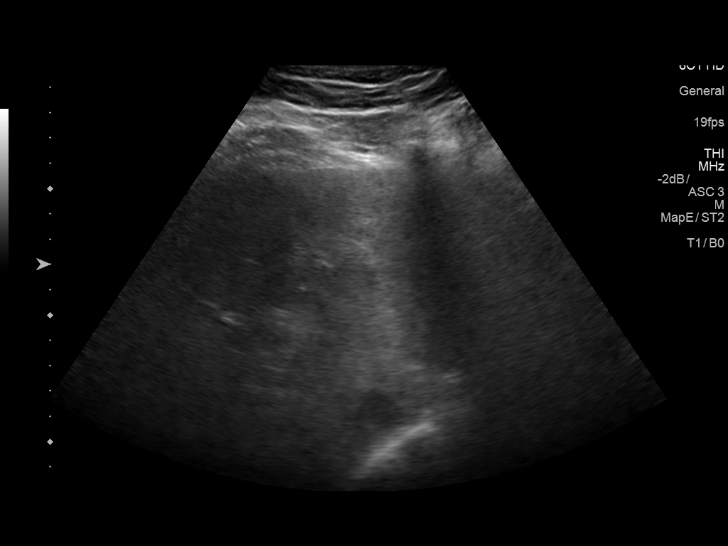
[im 26/39]
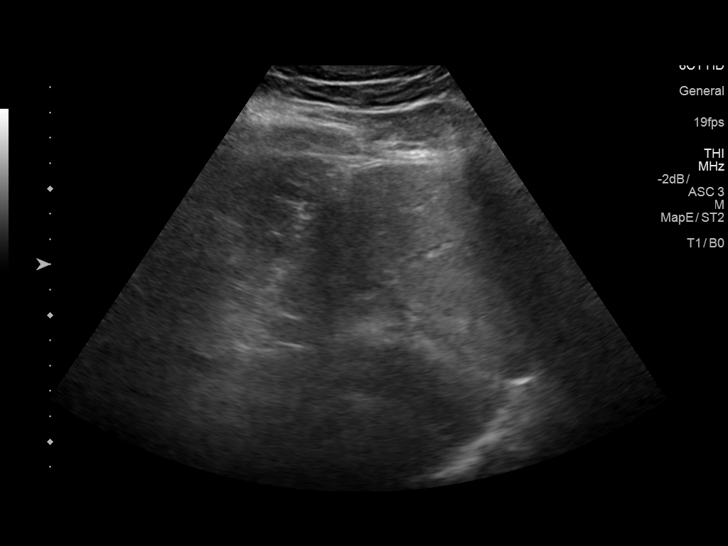
[im 29/39]
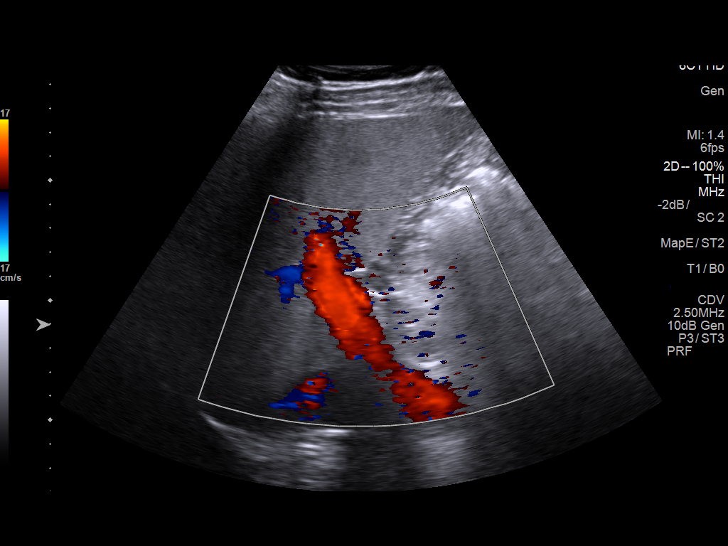
[im 32/39]
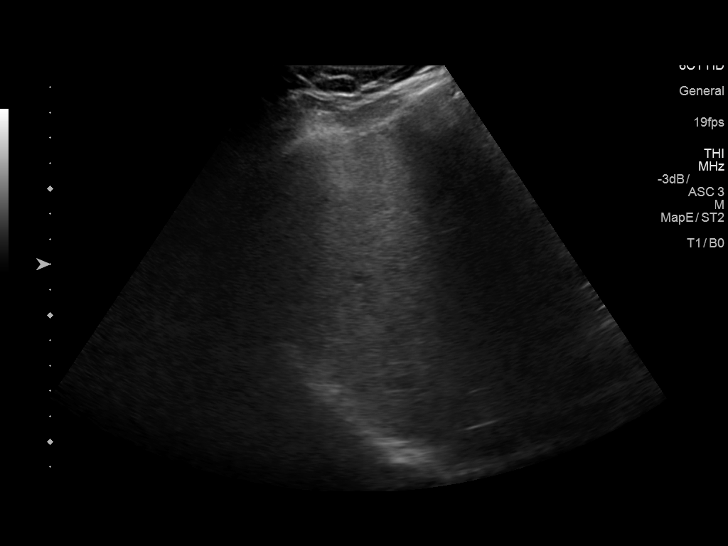
[im 35/39]
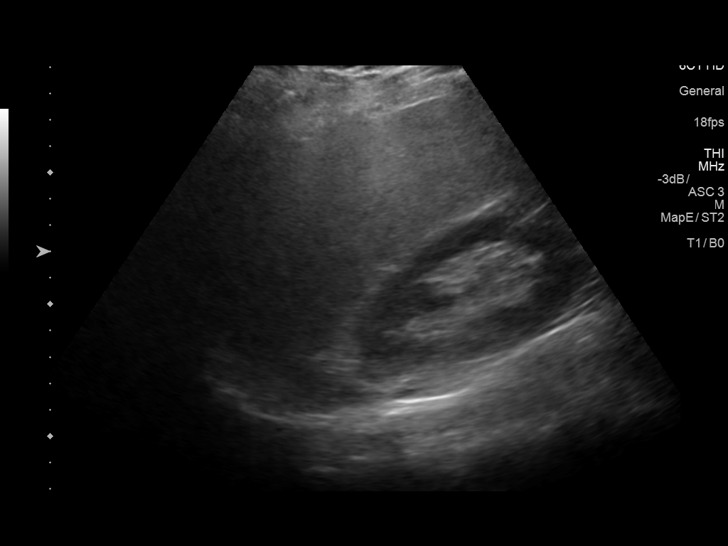
[im 39/39]
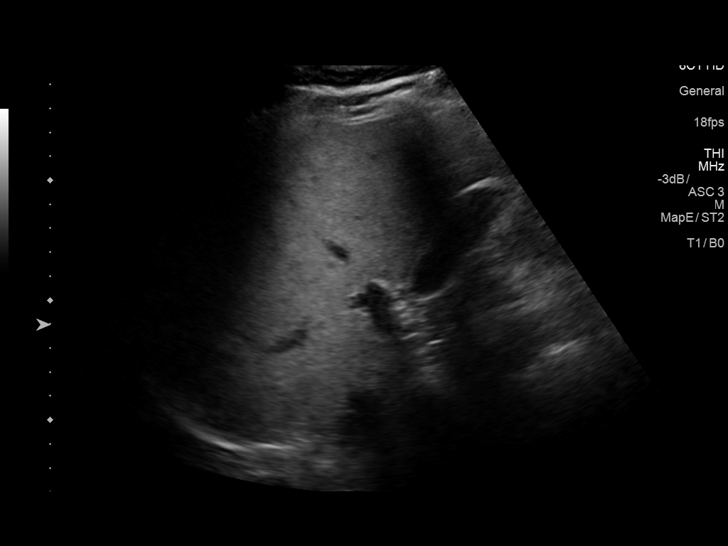

[14 of 25 positions shown; findings below may reference images not displayed]

FINDINGS: Gallbladder:

No gallstones or wall thickening visualized. No sonographic Murphy
sign noted by sonographer.

Common bile duct:

Diameter: 5 mm

Liver:

Increased hepatic echogenicity without focal abnormality. Portal
vein is patent on color Doppler imaging with normal direction of
blood flow towards the liver.
IMPRESSION: 1. Negative for gallstones or biliary dilatation
2. Increased hepatic echogenicity suggesting steatosis

## 2020-04-14 ENCOUNTER — Ambulatory Visit: Payer: Self-pay

## 2020-04-14 ENCOUNTER — Ambulatory Visit: Payer: BC Managed Care – PPO | Admitting: Family Medicine

## 2020-04-14 ENCOUNTER — Encounter: Payer: Self-pay | Admitting: Family Medicine

## 2020-04-14 ENCOUNTER — Other Ambulatory Visit: Payer: Self-pay

## 2020-04-14 DIAGNOSIS — M25531 Pain in right wrist: Secondary | ICD-10-CM

## 2020-04-14 MED ORDER — TRAMADOL HCL 50 MG PO TABS: 50.0000 mg | ORAL_TABLET | Freq: Every evening | ORAL | 0 refills | Status: DC | PRN

## 2020-04-14 NOTE — Progress Notes (Signed)
   Office Visit Note   Patient: Roger Hahn.           Date of Birth: [birth year]           MRN: [id number] Visit Date: 04/14/2020 Requested by: Roger Luster, MD [address] [place],  [place] [address] PCP: Roger Luster, MD  Subjective: Chief Complaint  Patient presents with  . 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  Gradual onset of pain about 8 months ago, no injury.  He cannot remember any major fall.  He started noticing intermittent pain and stiffness on the dorsum of his wrist.  The pain has become much more constant.  He gets intermittent numbness in both hands at night, but this seems unrelated to that.  He has tried carpal tunnel braces but it did not make any difference in his current symptoms.  It is severely painful when he tries to grip something hard.  It is also very painful when he bumps his hand into something, and axial load mechanism.              ROS:   All other systems were reviewed and are negative.  Objective: Vital Signs: There were no vitals taken for this visit.  Physical Exam:  General:  Alert and oriented, in no acute distress. Pulm:  Breathing unlabored. Psy:  Normal mood, congruent affect. Skin: No erythema or rash. Right wrist: He has limited dorsiflexion to about 40 degrees compared to about 60 degrees on the left.  Volar flexion is about 70 degrees.  He is able to fully pronate and supinate his forearm.  He is very tender to palpation dorsally over the lunate bone.  Negative Tinel's of carpal tunnel, negative Phalen's test and no thenar atrophy.  Imaging: XR Wrist Complete Right  Result Date: 04/14/2020 X-rays right wrist: He has ulna minus variance.  There is an apparent fracture through the body of the lunate bone with some fragmentation, I question whether there is some sclerosis of the dorsal aspect.  No other abnormality seen.   Assessment & Plan: 1.  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 -Elected to proceed with MRI scan of the wrist.  We will refer him to Dr. Amedeo Plenty for consultation.      Procedures: No procedures performed  No notes on file     PMFS History: There are no problems to display for this patient.  Past Medical History:  Diagnosis Date  . Kidney stones     Family History  Problem Relation Age of Onset  . Stroke Father     Past Surgical History:  Procedure Laterality Date  . CERVICAL DISC SURGERY     Social History   Occupational History  . Not on file  Tobacco Use  . Smoking status: Never Smoker  . Smokeless tobacco: Never Used  Substance and Sexual Activity  . Alcohol use: Yes    Alcohol/week: 0.0 standard drinks  . Drug use: No  . Sexual activity: Not on file

## 2020-04-14 NOTE — Progress Notes (Signed)
Pain for 8 months No numbness No known injury

## 2020-05-02 ENCOUNTER — Other Ambulatory Visit: Payer: Self-pay | Admitting: Geriatric Medicine

## 2020-05-02 ENCOUNTER — Telehealth: Payer: Self-pay | Admitting: Family Medicine

## 2020-05-02 DIAGNOSIS — E041 Nontoxic single thyroid nodule: Secondary | ICD-10-CM

## 2020-05-02 MED ORDER — HYDROCODONE-ACETAMINOPHEN 5-325 MG PO TABS: 0.5000 | ORAL_TABLET | Freq: Every evening | ORAL | 0 refills | Status: DC | PRN

## 2020-05-02 NOTE — Telephone Encounter (Signed)
Tramadol is what was prescribed here. Please advise.

## 2020-05-02 NOTE — Telephone Encounter (Signed)
Patient called advised the pain medicine is making him itch and patient said he stopped taking the medication. Patient said he can not see Dr. Amedeo Plenty until 06/19/2020.  Patient asked if there is another medication that can be prescribed for him? The number to contact patient is [phone]

## 2020-05-02 NOTE — Telephone Encounter (Signed)
I called and advised the patient the norco was sent in - to use sparingly.

## 2020-05-02 NOTE — Telephone Encounter (Signed)
Katrina from [facility] called requesting a call back from Dr. Junius Roads nurse or assistant. Pharmacy needs diagnosis for pain medication hydrocodone. [facility] contact number is [phone].

## 2020-05-02 NOTE — Telephone Encounter (Signed)
Norco Rx sent, but should try to use sparingly.

## 2020-05-02 NOTE — Telephone Encounter (Signed)
I called and advised Roger Hahn the diagnosis is chronic right wrist pain - ? Fractured lunate vs Kienbock's.

## 2020-05-03 ENCOUNTER — Telehealth: Payer: Self-pay | Admitting: Family Medicine

## 2020-05-03 NOTE — Telephone Encounter (Signed)
Thank you :)

## 2020-05-03 NOTE — Telephone Encounter (Signed)
Yes, appt scheduled May 13,21 at 330pm with Dr. Amedeo Plenty. Pt is aware of appt

## 2020-05-03 NOTE — Telephone Encounter (Signed)
Yes it is!

## 2020-05-03 NOTE — Telephone Encounter (Signed)
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  We will proceed with hand surgery referral as previously discussed.

## 2020-05-03 NOTE — Telephone Encounter (Signed)
Is the appointment still 06/19/20?

## 2020-05-16 ENCOUNTER — Ambulatory Visit
Admission: RE | Admit: 2020-05-16 | Discharge: 2020-05-16 | Disposition: A | Discharge status: Home / Self Care | Payer: BC Managed Care – PPO | Source: Ambulatory Visit | Attending: Geriatric Medicine | Admitting: Geriatric Medicine

## 2020-05-16 ENCOUNTER — Other Ambulatory Visit (HOSPITAL_COMMUNITY)
Admission: RE | Admit: 2020-05-16 | Discharge: 2020-05-16 | Disposition: A | Discharge status: Home / Self Care | Payer: BC Managed Care – PPO | Source: Ambulatory Visit | Attending: Radiology | Admitting: Radiology

## 2020-05-16 DIAGNOSIS — D34 Benign neoplasm of thyroid gland: Secondary | ICD-10-CM | POA: Diagnosis not present

## 2020-05-16 DIAGNOSIS — E041 Nontoxic single thyroid nodule: Secondary | ICD-10-CM | POA: Diagnosis present

## 2020-05-17 LAB — CYTOLOGY - NON PAP

## 2020-06-06 ENCOUNTER — Telehealth: Payer: Self-pay | Admitting: Family Medicine

## 2020-06-06 MED ORDER — HYDROCODONE-ACETAMINOPHEN 5-325 MG PO TABS
0.5000 | ORAL_TABLET | Freq: Every evening | ORAL | 0 refills | Status: DC | PRN
Start: 2020-06-06 — End: 2020-08-08

## 2020-06-06 NOTE — Telephone Encounter (Signed)
Pt called stating he set his appt with Dr Amedeo Plenty for 06/19/20.  [phone]

## 2020-06-06 NOTE — Addendum Note (Signed)
Addended by: Hortencia Pilar on: 06/06/2020 02:18 PM   Modules accepted: Orders

## 2020-06-06 NOTE — Telephone Encounter (Signed)
Patient called.   He is requesting a refill on his hydrocodone.   Call back: [phone]

## 2020-06-06 NOTE — Telephone Encounter (Signed)
Please advise 

## 2020-06-06 NOTE — Telephone Encounter (Signed)
Tried calling patient. No answer. Left detailed VM asking him to call us back and let us know when his appointment with Dr Amedeo Plenty has been scheduled.

## 2020-06-06 NOTE — Telephone Encounter (Signed)
Rx sent. When is appointment with Dr. Amedeo Plenty?

## 2020-08-08 ENCOUNTER — Telehealth: Payer: Self-pay | Admitting: Family Medicine

## 2020-08-08 MED ORDER — HYDROCODONE-ACETAMINOPHEN 5-325 MG PO TABS: 0.5000 | ORAL_TABLET | Freq: Every evening | ORAL | 0 refills | Status: DC | PRN

## 2020-08-08 NOTE — Telephone Encounter (Signed)
Left voice mail to call back to clarify: what type of surgery and when will it be? The last we had noted in the chart was that he was going to have surgery by Dr. Amedeo Plenty on 06/19/20.

## 2020-08-08 NOTE — Telephone Encounter (Signed)
The patient's surgery with Dr. Amedeo Plenty was postponed to 09/01/20. The patient reports he has been trying to go without the pain medication (hydrocodone), but lately he has started to have more aching in the wrist at night - not so bad during the day. He requests a refill on this medication.

## 2020-08-08 NOTE — Telephone Encounter (Signed)
Sent!

## 2020-08-08 NOTE — Addendum Note (Signed)
Addended by: Hortencia Pilar on: 08/08/2020 03:15 PM   Modules accepted: Orders

## 2020-08-08 NOTE — Telephone Encounter (Signed)
I called and advised the patient. 

## 2020-08-08 NOTE — Telephone Encounter (Signed)
Patient called.   He is requesting some pain medication to hold him until his upcoming surgery. He is also requesting that give him a call once it's been called in  Call back: [phone]

## 2021-02-21 ENCOUNTER — Telehealth: Payer: Self-pay

## 2021-02-21 NOTE — Telephone Encounter (Signed)
NOTES ON FILE FROM [facility] [phone] FOR [facility]. SENT REFERRAL AND NOTES TO SCHEDULING

## 2021-03-19 ENCOUNTER — Other Ambulatory Visit (HOSPITAL_COMMUNITY)
Admission: RE | Admit: 2021-03-19 | Discharge: 2021-03-19 | Disposition: A | Discharge status: Home / Self Care | Payer: BC Managed Care – PPO | Source: Ambulatory Visit | Attending: Geriatric Medicine | Admitting: Geriatric Medicine

## 2021-03-19 DIAGNOSIS — Z01812 Encounter for preprocedural laboratory examination: Secondary | ICD-10-CM | POA: Insufficient documentation

## 2021-03-19 DIAGNOSIS — Z20822 Contact with and (suspected) exposure to covid-19: Secondary | ICD-10-CM | POA: Insufficient documentation

## 2021-03-19 LAB — SARS CORONAVIRUS 2 (TAT 6-24 HRS): SARS Coronavirus 2: NEGATIVE

## 2021-03-20 ENCOUNTER — Ambulatory Visit (HOSPITAL_COMMUNITY)
Admission: RE | Admit: 2021-03-20 | Discharge: 2021-03-20 | Disposition: A | Discharge status: Home / Self Care | Payer: BC Managed Care – PPO | Source: Ambulatory Visit | Attending: Internal Medicine | Admitting: Internal Medicine

## 2021-03-20 ENCOUNTER — Other Ambulatory Visit (HOSPITAL_COMMUNITY): Payer: Self-pay | Admitting: Geriatric Medicine

## 2021-03-20 ENCOUNTER — Other Ambulatory Visit: Payer: Self-pay

## 2021-03-20 DIAGNOSIS — R079 Chest pain, unspecified: Secondary | ICD-10-CM | POA: Insufficient documentation

## 2021-03-21 LAB — EXERCISE TOLERANCE TEST
Estimated workload: 11.7 METS
Exercise duration (min): 10 min
Exercise duration (sec): 1 s
MPHR: 171 {beats}/min
Peak HR: 166 {beats}/min
Percent HR: 97 %
Rest HR: 71 {beats}/min

## 2022-08-20 ENCOUNTER — Other Ambulatory Visit: Payer: Self-pay

## 2022-08-20 ENCOUNTER — Encounter (HOSPITAL_COMMUNITY): Payer: Self-pay

## 2022-08-20 ENCOUNTER — Emergency Department (HOSPITAL_COMMUNITY)
Admission: EM | Admit: 2022-08-20 | Discharge: 2022-08-20 | Disposition: A | Discharge status: Home / Self Care | Payer: No Typology Code available for payment source | Attending: Student | Admitting: Student

## 2022-08-20 ENCOUNTER — Emergency Department (HOSPITAL_COMMUNITY): Payer: No Typology Code available for payment source

## 2022-08-20 DIAGNOSIS — R1013 Epigastric pain: Secondary | ICD-10-CM | POA: Insufficient documentation

## 2022-08-20 DIAGNOSIS — K2900 Acute gastritis without bleeding: Secondary | ICD-10-CM

## 2022-08-20 LAB — URINALYSIS, ROUTINE W REFLEX MICROSCOPIC
Bilirubin Urine: NEGATIVE
Glucose, UA: NEGATIVE mg/dL
Hgb urine dipstick: NEGATIVE
Ketones, ur: NEGATIVE mg/dL
Leukocytes,Ua: NEGATIVE
Nitrite: NEGATIVE
Protein, ur: NEGATIVE mg/dL
Specific Gravity, Urine: 1.016 (ref 1.005–1.030)
pH: 5 (ref 5.0–8.0)

## 2022-08-20 LAB — LIPASE, BLOOD: Lipase: 37 U/L (ref 11–51)

## 2022-08-20 LAB — COMPREHENSIVE METABOLIC PANEL
ALT: 27 U/L (ref 0–44)
AST: 22 U/L (ref 15–41)
Albumin: 3.9 g/dL (ref 3.5–5.0)
Alkaline Phosphatase: 48 U/L (ref 38–126)
Anion gap: 6 (ref 5–15)
BUN: 17 mg/dL (ref 6–20)
CO2: 26 mmol/L (ref 22–32)
Calcium: 9.4 mg/dL (ref 8.9–10.3)
Chloride: 108 mmol/L (ref 98–111)
Creatinine, Ser: 1.27 mg/dL — ABNORMAL HIGH (ref 0.61–1.24)
GFR, Estimated: >60 mL/min (ref >60)
Glucose, Bld: 105 mg/dL — ABNORMAL HIGH (ref 70–99)
Potassium: 3.7 mmol/L (ref 3.5–5.1)
Sodium: 140 mmol/L (ref 135–145)
Total Bilirubin: 0.5 mg/dL (ref 0.3–1.2)
Total Protein: 7.2 g/dL (ref 6.5–8.1)

## 2022-08-20 LAB — TROPONIN I (HIGH SENSITIVITY)
Troponin I (High Sensitivity): 2 ng/L (ref <18)
Troponin I (High Sensitivity): <2 ng/L (ref <18)

## 2022-08-20 LAB — CBC
HCT: 49 % (ref 39.0–52.0)
Hemoglobin: 16.6 g/dL (ref 13.0–17.0)
MCH: 32.4 pg (ref 26.0–34.0)
MCHC: 33.9 g/dL (ref 30.0–36.0)
MCV: 95.7 fL (ref 80.0–100.0)
Platelets: 217 10*3/uL (ref 150–400)
RBC: 5.12 MIL/uL (ref 4.22–5.81)
RDW: 12 % (ref 11.5–15.5)
WBC: 5.8 10*3/uL (ref 4.0–10.5)
nRBC: 0 % (ref 0.0–0.2)

## 2022-08-20 MED ORDER — LIDOCAINE VISCOUS HCL 2 % MT SOLN
15.0000 mL | Freq: Once | OROMUCOSAL | Status: AC
  Administered 2022-08-20: 15 mL via ORAL
  Filled 2022-08-20: qty 15

## 2022-08-20 MED ORDER — SODIUM CHLORIDE (PF) 0.9 % IJ SOLN
INTRAMUSCULAR | Status: AC
  Filled 2022-08-20: qty 50

## 2022-08-20 MED ORDER — IOHEXOL 300 MG/ML  SOLN
100.0000 mL | Freq: Once | INTRAMUSCULAR | Status: AC | PRN
  Administered 2022-08-20: 100 mL via INTRAVENOUS

## 2022-08-20 MED ORDER — ALUM & MAG HYDROXIDE-SIMETH 200-200-20 MG/5ML PO SUSP
30.0000 mL | Freq: Once | ORAL | Status: AC
  Administered 2022-08-20: 30 mL via ORAL
  Filled 2022-08-20: qty 30

## 2022-08-20 MED ORDER — FAMOTIDINE 20 MG PO TABS: 20.0000 mg | ORAL_TABLET | Freq: Two times a day (BID) | ORAL | 0 refills | Status: DC

## 2022-08-20 MED ORDER — MAALOX MAX 400-400-40 MG/5ML PO SUSP: 15.0000 mL | Freq: Four times a day (QID) | ORAL | 0 refills | Status: DC | PRN

## 2022-08-20 NOTE — ED Provider Notes (Signed)
[facility] DEPT Provider Note  CSN: [id number] Arrival date & time: 08/20/22 0421  Chief Complaint(s) Abdominal Pain  HPI Roger Hahn. is a 51 y.o. male who presents emergency department for evaluation of abdominal pain.  Patient states that he ate a [employer] burrito last night and awoke this morning with severe epigastric abdominal pain.  He states that he does have a history of gastric reflux but he has never felt pain this severe before.  Patient unfortunately waited in the waiting room for multiple hours and by the time that I evaluated the patient in the emergency department room, his symptoms have improved.  He currently rates his symptoms at 3 out of 10.  Denies associated vomiting, diarrhea, headache, fever or other systemic symptoms.   Past Medical History Past Medical History:  Diagnosis Date   Kidney stones    There are no problems to display for this patient.  Home Medication(s) Prior to Admission medications   Medication Sig Start Date End Date Taking? Authorizing Provider  HYDROcodone-acetaminophen (NORCO/VICODIN) 5-325 MG tablet Take 0.5-1 tablets by mouth at bedtime as needed for severe pain. 08/08/20   Hilts, Legrand Como, MD  ibuprofen (ADVIL,MOTRIN) 200 MG tablet Take 400-600 mg by mouth every 6 (six) hours as needed for moderate pain.    [provider]  rosuvastatin (CRESTOR) 10 MG tablet Take 10 mg by mouth daily. 02/25/20   [provider]  traMADol (ULTRAM) 50 MG tablet Take 1-2 tablets (50-100 mg total) by mouth at bedtime as needed. 04/14/20   Hilts, Legrand Como, MD                                                                                                                                    Past Surgical History Past Surgical History:  Procedure Laterality Date   CERVICAL DISC SURGERY     Family History Family History  Problem Relation Age of Onset   Stroke Father     Social History Social History    Tobacco Use   Smoking status: Never   Smokeless tobacco: Never  Substance Use Topics   Alcohol use: Yes    Alcohol/week: 0.0 standard drinks of alcohol   Drug use: No   Allergies Patient has no known allergies.  Review of Systems Review of Systems  Gastrointestinal:  Positive for abdominal pain and nausea.    Physical Exam Vital Signs  I have reviewed the triage vital signs BP (!) 136/97   Pulse 69   Temp (!) 97.5 F (36.4 C) (Oral)   Resp 17   Ht '5\' 11"'$  (1.803 m)   Wt 99.8 kg   SpO2 100%   BMI 30.68 kg/m   Physical Exam Constitutional:      General: He is not in acute distress.    Appearance: Normal appearance.  HENT:     Head: Normocephalic and atraumatic.     Nose: No congestion  or rhinorrhea.  Eyes:     General:        Right eye: No discharge.        Left eye: No discharge.     Extraocular Movements: Extraocular movements intact.     Pupils: Pupils are equal, round, and reactive to light.  Cardiovascular:     Rate and Rhythm: Normal rate and regular rhythm.     Heart sounds: No murmur heard. Pulmonary:     Effort: No respiratory distress.     Breath sounds: No wheezing or rales.  Abdominal:     General: There is no distension.     Tenderness: There is abdominal tenderness in the epigastric area.  Musculoskeletal:        General: Normal range of motion.     Cervical back: Normal range of motion.  Skin:    General: Skin is warm and dry.  Neurological:     General: No focal deficit present.     Mental Status: He is alert.     ED Results and Treatments Labs (all labs ordered are listed, but only abnormal results are displayed) Labs Reviewed  COMPREHENSIVE METABOLIC PANEL - Abnormal; Notable for the following components:      Result Value   Glucose, Bld 105 (*)    Creatinine, Ser 1.27 (*)    All other components within normal limits  LIPASE, BLOOD  CBC  URINALYSIS, ROUTINE W REFLEX MICROSCOPIC  TROPONIN I (HIGH SENSITIVITY)  TROPONIN I  (HIGH SENSITIVITY)                                                                                                                          Radiology No results found.  Pertinent labs & imaging results that were available during my care of the patient were reviewed by me and considered in my medical decision making (see MDM for details).  Medications Ordered in ED Medications  alum & mag hydroxide-simeth (MAALOX/MYLANTA) 200-200-20 MG/5ML suspension 30 mL (has no administration in time range)    And  lidocaine (XYLOCAINE) 2 % viscous mouth solution 15 mL (has no administration in time range)                                                                                                                                     Procedures Procedures  (including critical care time)  Medical Decision Making /  ED Course   This patient presents to the ED for concern of abdominal pain, this involves an extensive number of treatment options, and is a complaint that carries with it a high risk of complications and morbidity.  The differential diagnosis includes gastritis, pancreatitis, cholecystitis, nephrolithiasis  MDM: Seen emergency room for evaluation of abdominal pain.  Physical exam with very mild tenderness in the epigastrium but is otherwise unremarkable.  Laboratory evaluation with a mild creatinine elevation to 1.27 but is otherwise unremarkable.  CT abdomen pelvis unremarkable.  Patient given a GI cocktail and on reevaluation his symptoms have resolved.  Suspect symptoms are secondary to severe gastritis and as the patient symptoms have resolved, he is safe for discharge with outpatient follow-up.  He was given prescriptions for Pepcid and Maalox and return precautions of which she voiced understanding.  Patient then discharged.   Additional history obtained: -Additional history obtained from wife -External records from outside source obtained and reviewed including: Chart review  including previous notes, labs, imaging, consultation notes   Lab Tests: -I ordered, reviewed, and interpreted labs.   The pertinent results include:   Labs Reviewed  COMPREHENSIVE METABOLIC PANEL - Abnormal; Notable for the following components:      Result Value   Glucose, Bld 105 (*)    Creatinine, Ser 1.27 (*)    All other components within normal limits  LIPASE, BLOOD  CBC  URINALYSIS, ROUTINE W REFLEX MICROSCOPIC  TROPONIN I (HIGH SENSITIVITY)  TROPONIN I (HIGH SENSITIVITY)      EKG   EKG Interpretation  Date/Time:  Tuesday August 20 2022 05:23:15 EDT Ventricular Rate:  60 PR Interval:  153 QRS Duration: 101 QT Interval:  404 QTC Calculation: 404 R Axis:   70 Text Interpretation: Sinus rhythm Confirmed by [place] ([id number]) on 08/20/2022 9:00:32 AM         Imaging Studies ordered: I ordered imaging studies including CT abdomen pelvis I independently visualized and interpreted imaging. I agree with the radiologist interpretation   Medicines ordered and prescription drug management: Meds ordered this encounter  Medications   AND Linked Order Group    alum & mag hydroxide-simeth (MAALOX/MYLANTA) 200-200-20 MG/5ML suspension 30 mL    lidocaine (XYLOCAINE) 2 % viscous mouth solution 15 mL    -I have reviewed the patients home medicines and have made adjustments as needed  Critical interventions none  Cardiac Monitoring: The patient was maintained on a cardiac monitor.  I personally viewed and interpreted the cardiac monitored which showed an underlying rhythm of: NSR  Social Determinants of Health:  Factors impacting patients care include: none   Reevaluation: After the interventions noted above, I reevaluated the patient and found that they have :improved  Co morbidities that complicate the patient evaluation  Past Medical History:  Diagnosis Date   Kidney stones       Dispostion: I considered admission for this patient, but with symptoms  resolved and overall negative ER work-up, patient safe for discharge with outpatient follow-up     Final Clinical Impression(s) / ED Diagnoses Final diagnoses:  None     '@PCDICTATION'$ @    Teressa Lower, MD 08/20/22 [phone]

## 2022-08-20 NOTE — ED Triage Notes (Signed)
Patient reports epigastric pain that gets worst with movement. Patient states the pain is intense and has never experienced pain like this before. Patients feels nauseous but no vomiting.

## 2022-08-20 NOTE — ED Notes (Signed)
Called lab to have troponin added.

## 2023-01-08 ENCOUNTER — Other Ambulatory Visit: Payer: Self-pay

## 2023-01-08 ENCOUNTER — Encounter (HOSPITAL_BASED_OUTPATIENT_CLINIC_OR_DEPARTMENT_OTHER): Payer: Self-pay

## 2023-01-08 ENCOUNTER — Emergency Department (HOSPITAL_BASED_OUTPATIENT_CLINIC_OR_DEPARTMENT_OTHER)
Admission: EM | Admit: 2023-01-08 | Discharge: 2023-01-08 | Disposition: A | Discharge status: Home / Self Care | Payer: No Typology Code available for payment source | Attending: Emergency Medicine | Admitting: Emergency Medicine

## 2023-01-08 DIAGNOSIS — R1013 Epigastric pain: Secondary | ICD-10-CM

## 2023-01-08 DIAGNOSIS — R110 Nausea: Secondary | ICD-10-CM | POA: Insufficient documentation

## 2023-01-08 LAB — LIPASE, BLOOD: Lipase: 20 U/L (ref 11–51)

## 2023-01-08 LAB — COMPREHENSIVE METABOLIC PANEL
ALT: 24 U/L (ref 0–44)
AST: 18 U/L (ref 15–41)
Albumin: 4.4 g/dL (ref 3.5–5.0)
Alkaline Phosphatase: 48 U/L (ref 38–126)
Anion gap: 10 (ref 5–15)
BUN: 13 mg/dL (ref 6–20)
CO2: 25 mmol/L (ref 22–32)
Calcium: 9.7 mg/dL (ref 8.9–10.3)
Chloride: 103 mmol/L (ref 98–111)
Creatinine, Ser: 1.14 mg/dL (ref 0.61–1.24)
GFR, Estimated: >60 mL/min (ref >60)
Glucose, Bld: 116 mg/dL — ABNORMAL HIGH (ref 70–99)
Potassium: 4 mmol/L (ref 3.5–5.1)
Sodium: 138 mmol/L (ref 135–145)
Total Bilirubin: 0.6 mg/dL (ref 0.3–1.2)
Total Protein: 7.5 g/dL (ref 6.5–8.1)

## 2023-01-08 LAB — CBC
HCT: 45.5 % (ref 39.0–52.0)
Hemoglobin: 15.8 g/dL (ref 13.0–17.0)
MCH: 32.2 pg (ref 26.0–34.0)
MCHC: 34.7 g/dL (ref 30.0–36.0)
MCV: 92.9 fL (ref 80.0–100.0)
Platelets: 217 10*3/uL (ref 150–400)
RBC: 4.9 MIL/uL (ref 4.22–5.81)
RDW: 12 % (ref 11.5–15.5)
WBC: 7.2 10*3/uL (ref 4.0–10.5)
nRBC: 0 % (ref 0.0–0.2)

## 2023-01-08 LAB — TROPONIN I (HIGH SENSITIVITY): Troponin I (High Sensitivity): 2 ng/L (ref <18)

## 2023-01-08 MED ORDER — LIDOCAINE VISCOUS HCL 2 % MT SOLN: 15.0000 mL | OROMUCOSAL | 0 refills | Status: DC | PRN

## 2023-01-08 MED ORDER — LIDOCAINE VISCOUS HCL 2 % MT SOLN
15.0000 mL | Freq: Once | OROMUCOSAL | Status: AC
  Administered 2023-01-08: 15 mL via ORAL
  Filled 2023-01-08: qty 15

## 2023-01-08 MED ORDER — FAMOTIDINE 20 MG PO TABS: 20.0000 mg | ORAL_TABLET | Freq: Every day | ORAL | 0 refills | Status: AC

## 2023-01-08 MED ORDER — ALUM & MAG HYDROXIDE-SIMETH 200-200-20 MG/5ML PO SUSP
30.0000 mL | Freq: Once | ORAL | Status: AC
  Administered 2023-01-08: 30 mL via ORAL
  Filled 2023-01-08: qty 30

## 2023-01-08 MED ORDER — FAMOTIDINE 20 MG PO TABS
20.0000 mg | ORAL_TABLET | Freq: Once | ORAL | Status: AC
  Administered 2023-01-08: 20 mg via ORAL
  Filled 2023-01-08: qty 1

## 2023-01-08 MED ORDER — MAALOX MAX 400-400-40 MG/5ML PO SUSP: 15.0000 mL | Freq: Four times a day (QID) | ORAL | 0 refills | Status: AC | PRN

## 2023-01-08 NOTE — Discharge Instructions (Signed)
You were seen in the emergency department for your abdominal pain.  You had no signs of heart attack, liver or pancreas dysfunction and no signs of bleeding.  This is likely due to severe gastritis or acid reflux.  You should take your omeprazole daily and you can also take Pepcid daily for at least the next 2 weeks to see if it helps with your symptoms.  You can use the Maalox and viscous lidocaine as needed for continued pain.  You can follow-up with your primary doctor and likely need to see GI and have a repeat scope due to your continued pain.  You should return to the emergency department if you have significantly worsening pain, repetitive vomiting, fevers or any other new or concerning symptoms.

## 2023-01-08 NOTE — ED Notes (Signed)
Patient verbalizes understanding of discharge instructions. Opportunity for questioning and answers were provided. Patient discharged from ED.

## 2023-01-08 NOTE — ED Triage Notes (Signed)
POV from home, c/o upper epigastric pain 6pm yesterday and diarrhea. Tried otc tx without relief. Hx a gastritis, nad, A&O x 4, amb to triage.

## 2023-01-08 NOTE — ED Provider Notes (Signed)
[facility] DEPT Provider Note   CSN: [id number] Arrival date & time: 01/08/23  0457     History  Chief Complaint  Patient presents with   Abdominal Pain    Roger Hahn. is a 52 y.o. male.  Patient is a 52 year old male with no significant past medical history presenting to the emergency department with abdominal pain.  Patient states around 6 PM last night he started to develop epigastric abdominal pain that was sharp and gnawing in nature.  He states that the pain is constant since it started and did not start to ease off until around 6 AM this morning.  He states he had nausea but denies any vomiting.  He denies any fevers or chills.  He thought that it may be related to gas or constipation and took milk of magnesia and had 1 episode of diarrhea but states it did not change the pain.  He denies any black or bloody stools.  He states that he had a similar attack in August or he was seen here and diagnosed with gastritis.  He states he had an endoscopy about 1 year ago that was normal.  The history is provided by the patient.  Abdominal Pain      Home Medications Prior to Admission medications   Medication Sig Start Date End Date Taking? Authorizing Provider  alum & mag hydroxide-simeth (MAALOX MAX) 400-400-40 MG/5ML suspension Take 15 mLs by mouth every 6 (six) hours as needed for indigestion. 01/08/23  Yes Maylon Peppers, Jordan Hawks K, DO  famotidine (PEPCID) 20 MG tablet Take 1 tablet (20 mg total) by mouth daily. 01/08/23  Yes Maylon Peppers, [place] K, DO  lidocaine (XYLOCAINE) 2 % solution Use as directed 15 mLs in the mouth or throat as needed for mouth pain. 01/08/23  Yes Leanord Asal K, DO  HYDROcodone-acetaminophen (NORCO/VICODIN) 5-325 MG tablet Take 0.5-1 tablets by mouth at bedtime as needed for severe pain. 08/08/20   Hilts, Legrand Como, MD  ibuprofen (ADVIL,MOTRIN) 200 MG tablet Take 400-600 mg by mouth every 6 (six) hours as needed for moderate pain.     [provider]  rosuvastatin (CRESTOR) 10 MG tablet Take 10 mg by mouth daily. 02/25/20   [provider]  traMADol (ULTRAM) 50 MG tablet Take 1-2 tablets (50-100 mg total) by mouth at bedtime as needed. 04/14/20   Hilts, Legrand Como, MD      Allergies    Patient has no known allergies.    Review of Systems   Review of Systems  Gastrointestinal:  Positive for abdominal pain.    Physical Exam Updated Vital Signs BP (!) 126/100 (BP Location: Right Arm)   Pulse 70   Temp 98.6 F (37 C) (Oral)   Resp 16   Ht '5\' 11"'$  (1.803 m)   Wt 99.8 kg   SpO2 99%   BMI 30.68 kg/m  Physical Exam Vitals and nursing note reviewed.  Constitutional:      General: He is not in acute distress.    Appearance: He is well-developed.  HENT:     Head: Normocephalic and atraumatic.     Mouth/Throat:     Pharynx: Oropharynx is clear.  Eyes:     Extraocular Movements: Extraocular movements intact.     Pupils: Pupils are equal, round, and reactive to light.  Cardiovascular:     Rate and Rhythm: Normal rate and regular rhythm.  Pulmonary:     Effort: Pulmonary effort is normal.     Breath sounds: Normal breath  sounds.  Abdominal:     General: Abdomen is flat.     Palpations: Abdomen is soft.     Tenderness: There is no abdominal tenderness. There is no guarding or rebound.  Skin:    General: Skin is warm and dry.  Neurological:     General: No focal deficit present.     Mental Status: He is alert and oriented to person, place, and time.  Psychiatric:        Mood and Affect: Mood normal.        Behavior: Behavior normal.     ED Results / Procedures / Treatments   Labs (all labs ordered are listed, but only abnormal results are displayed) Labs Reviewed  COMPREHENSIVE METABOLIC PANEL - Abnormal; Notable for the following components:      Result Value   Glucose, Bld 116 (*)    All other components within normal limits  LIPASE, BLOOD  CBC  TROPONIN I (HIGH SENSITIVITY)     EKG EKG Interpretation  Date/Time:  Wednesday January 08 2023 05:05:32 EST Ventricular Rate:  73 PR Interval:  138 QRS Duration: 94 QT Interval:  398 QTC Calculation: 438 R Axis:   78 Text Interpretation: Normal sinus rhythm Nonspecific ST abnormality Abnormal ECG When compared with ECG of 20-Aug-2022 05:23, PREVIOUS ECG IS PRESENT No significant change was found Confirmed by Ezequiel Essex [phone]) on 01/08/2023 5:23:03 AM  Radiology No results found.  Procedures Procedures    Medications Ordered in ED Medications  famotidine (PEPCID) tablet 20 mg (20 mg Oral Given 01/08/23 0825)  alum & mag hydroxide-simeth (MAALOX/MYLANTA) 200-200-20 MG/5ML suspension 30 mL (30 mLs Oral Given 01/08/23 0826)    And  lidocaine (XYLOCAINE) 2 % viscous mouth solution 15 mL (15 mLs Oral Given 01/08/23 5945)    ED Course/ Medical Decision Making/ A&P                             Medical Decision Making This patient presents to the ED with chief complaint(s) of abdominal pain with no pertinent past medical history of which further complicates the presenting complaint. The complaint involves an extensive differential diagnosis and also carries with it a high risk of complications and morbidity.    The differential diagnosis includes gastritis, GERD, pancreatitis, hepatitis, cholelithiasis or cholecystitis, atypical ACS  Additional history obtained: Additional history obtained from N/A Records reviewed prior ED records  ED Course and Reassessment: Patient was initially evaluated in triage and had EKG and labs performed.  Labs are within normal range.  EKG is nonischemic and troponin is negative and symptoms have been ongoing for greater than 12 hours so single troponin is sufficient to making ACS unlikely.  Patient states that the pain feels the exact same as when he was in the ED 6 months ago and had CT imaging at that time that showed a normal-appearing gallbladder.  He has no right upper  quadrant tenderness making cholelithiasis or cholecystitis unlikely.  Patient's symptoms likely secondary to gastritis or GERD.  He states that he has Prilosec at home and will additionally be given Pepcid and GI cocktail.  He was recommended GI follow-up and given strict return precautions.  Independent labs interpretation:  The following labs were independently interpreted: Within normal range  Independent visualization of imaging: N/A  Consultation: - Consulted or discussed management/test interpretation w/ external professional: N/A  Consideration for admission or further workup: Patient has no emergent conditions requiring admission or  further work-up at this time and is stable for discharge home with primary care and GI follow-up  Social Determinants of health: N/A    Amount and/or Complexity of Data Reviewed Labs: ordered.  Risk OTC drugs. Prescription drug management.          Final Clinical Impression(s) / ED Diagnoses Final diagnoses:  Epigastric pain    Rx / DC Orders ED Discharge Orders          Ordered    lidocaine (XYLOCAINE) 2 % solution  As needed        01/08/23 0831    alum & mag hydroxide-simeth (MAALOX MAX) 848-592-76 MG/5ML suspension  Every 6 hours PRN        01/08/23 0831    famotidine (PEPCID) 20 MG tablet  Daily        01/08/23 0831              Kemper Durie, DO 01/08/23 [phone]

## 2023-05-30 ENCOUNTER — Ambulatory Visit
Admission: EM | Admit: 2023-05-30 | Discharge: 2023-05-30 | Disposition: A | Discharge status: Home / Self Care | Payer: No Typology Code available for payment source | Attending: Family Medicine | Admitting: Family Medicine

## 2023-05-30 DIAGNOSIS — J019 Acute sinusitis, unspecified: Secondary | ICD-10-CM | POA: Diagnosis not present

## 2023-05-30 MED ORDER — CEFDINIR 300 MG PO CAPS: 600.0000 mg | ORAL_CAPSULE | Freq: Every day | ORAL | 0 refills | Status: AC

## 2023-05-30 MED ORDER — TRIAMCINOLONE ACETONIDE 40 MG/ML IJ SUSP
40.0000 mg | Freq: Once | INTRAMUSCULAR | Status: AC
  Administered 2023-05-30: 40 mg via INTRAMUSCULAR

## 2023-05-30 NOTE — ED Triage Notes (Signed)
Pt states congestion and headache for the past 2 weeks. Has been taking tylenol,motrin and Nyquil at home.

## 2023-05-30 NOTE — Discharge Instructions (Signed)
Take cefdinir 300 mg--2 capsules together daily for 7 days  We have given you a shot of triamcinolone 40 mg; this is a cortisone type medication to help with the inflammation and swelling in her sinuses.  Mucinex D may help your congestion and headache better He can also continue to take ibuprofen or Tylenol for your headache.

## 2023-05-30 NOTE — ED Provider Notes (Signed)
[facility]    CSN: [id number] Arrival date & time: 05/30/23  1457      History   Chief Complaint Chief Complaint  Patient presents with   Nasal Congestion    HPI Roger Hahn. is a 52 y.o. male.   HPI Here for 2-week history of nasal congestion.  At first he had some sore throat and cough and nasal congestion.  Now he is having more sinus pressure and his face and cheeks and forehead.  The sinus pressure began about 4 days ago.  No fever at any point.  No nausea or vomiting.  No shortness of breath  Past medical history is negative for diabetes or hypertension    Past Medical History:  Diagnosis Date   Kidney stones     There are no problems to display for this patient.   Past Surgical History:  Procedure Laterality Date   CERVICAL DISC SURGERY         Home Medications    Prior to Admission medications   Medication Sig Start Date End Date Taking? Authorizing Provider  cefdinir (OMNICEF) 300 MG capsule Take 2 capsules (600 mg total) by mouth daily for 7 days. 05/30/23 06/06/23 Yes Wrigley Plasencia, Janace Aris, MD  alum & mag hydroxide-simeth (MAALOX MAX) 400-400-40 MG/5ML suspension Take 15 mLs by mouth every 6 (six) hours as needed for indigestion. 01/08/23   Elayne Snare K, DO  famotidine (PEPCID) 20 MG tablet Take 1 tablet (20 mg total) by mouth daily. 01/08/23   Elayne Snare K, DO  ibuprofen (ADVIL,MOTRIN) 200 MG tablet Take 400-600 mg by mouth every 6 (six) hours as needed for moderate pain.    [provider]  rosuvastatin (CRESTOR) 10 MG tablet Take 10 mg by mouth daily. 02/25/20   [provider]    Family History Family History  Problem Relation Age of Onset   Stroke Father     Social History Social History   Tobacco Use   Smoking status: Never   Smokeless tobacco: Never  Substance Use Topics   Alcohol use: Yes    Alcohol/week: 0.0 standard drinks of alcohol   Drug use: No     Allergies   Patient has no  known allergies.   Review of Systems Review of Systems   Physical Exam Triage Vital Signs ED Triage Vitals  Enc Vitals Group     BP 05/30/23 1506 (!) 132/91     Pulse Rate 05/30/23 1506 80     Resp 05/30/23 1506 16     Temp 05/30/23 1506 98.1 F (36.7 C)     Temp Source 05/30/23 1506 Oral     SpO2 05/30/23 1506 96 %     Weight --      Height --      Head Circumference --      Peak Flow --      Pain Score 05/30/23 1507 8     Pain Loc --      Pain Edu? --      Excl. in GC? --    No data found.  Updated Vital Signs BP (!) 132/91 (BP Location: Left Arm)   Pulse 80   Temp 98.1 F (36.7 C) (Oral)   Resp 16   SpO2 96%   Visual Acuity Right Eye Distance:   Left Eye Distance:   Bilateral Distance:    Right Eye Near:   Left Eye Near:    Bilateral Near:     Physical Exam  Vitals reviewed.  Constitutional:      General: He is not in acute distress.    Appearance: He is not ill-appearing, toxic-appearing or diaphoretic.  HENT:     Nose: Nose normal.     Mouth/Throat:     Mouth: Mucous membranes are moist.     Pharynx: No oropharyngeal exudate or posterior oropharyngeal erythema.  Eyes:     Extraocular Movements: Extraocular movements intact.     Conjunctiva/sclera: Conjunctivae normal.     Pupils: Pupils are equal, round, and reactive to light.  Cardiovascular:     Rate and Rhythm: Normal rate and regular rhythm.     Heart sounds: No murmur heard. Pulmonary:     Effort: Pulmonary effort is normal.     Breath sounds: Normal breath sounds.  Musculoskeletal:     Cervical back: Neck supple.  Lymphadenopathy:     Cervical: No cervical adenopathy.  Skin:    Capillary Refill: Capillary refill takes less than 2 seconds.     Coloration: Skin is not jaundiced or pale.  Neurological:     General: No focal deficit present.     Mental Status: He is alert and oriented to person, place, and time.  Psychiatric:        Behavior: Behavior normal.      UC Treatments /  Results  Labs (all labs ordered are listed, but only abnormal results are displayed) Labs Reviewed - No data to display  EKG   Radiology No results found.  Procedures Procedures (including critical care time)  Medications Ordered in UC Medications  triamcinolone acetonide (KENALOG-40) injection 40 mg (has no administration in time range)    Initial Impression / Assessment and Plan / UC Course  I have reviewed the triage vital signs and the nursing notes.  Pertinent labs & imaging results that were available during my care of the patient were reviewed by me and considered in my medical decision making (see chart for details).        Omnicef is sent in for sinus infection, and triamcinolone is given for the sinus inflammation. Final Clinical Impressions(s) / UC Diagnoses   Final diagnoses:  Acute sinusitis, recurrence not specified, unspecified location     Discharge Instructions      Take cefdinir 300 mg--2 capsules together daily for 7 days  We have given you a shot of triamcinolone 40 mg; this is a cortisone type medication to help with the inflammation and swelling in her sinuses.  Mucinex D may help your congestion and headache better He can also continue to take ibuprofen or Tylenol for your headache.      ED Prescriptions     Medication Sig Dispense Auth. Provider   cefdinir (OMNICEF) 300 MG capsule Take 2 capsules (600 mg total) by mouth daily for 7 days. 14 capsule Marlinda Mike, Janace Aris, MD      PDMP not reviewed this encounter.   Zenia Resides, MD 05/30/23 1537

## 2024-09-21 ENCOUNTER — Other Ambulatory Visit (HOSPITAL_BASED_OUTPATIENT_CLINIC_OR_DEPARTMENT_OTHER): Payer: Self-pay | Admitting: Physician Assistant

## 2024-09-21 DIAGNOSIS — E782 Mixed hyperlipidemia: Secondary | ICD-10-CM

## 2024-10-18 ENCOUNTER — Other Ambulatory Visit (HOSPITAL_BASED_OUTPATIENT_CLINIC_OR_DEPARTMENT_OTHER)

## 2024-10-18 ENCOUNTER — Encounter (HOSPITAL_BASED_OUTPATIENT_CLINIC_OR_DEPARTMENT_OTHER): Payer: Self-pay
